# Patient Record
Sex: Female | Born: 1970 | Race: White | Hispanic: No | Marital: Married | Smoking: Never smoker
Health system: Southern US, Community
[De-identification: ages and names within clinical notes are randomized; demographics above are authoritative.]

## PROBLEM LIST (undated history)

## (undated) DIAGNOSIS — I10 Essential (primary) hypertension: Secondary | ICD-10-CM

## (undated) DIAGNOSIS — J45909 Unspecified asthma, uncomplicated: Secondary | ICD-10-CM

## (undated) DIAGNOSIS — F319 Bipolar disorder, unspecified: Secondary | ICD-10-CM

## (undated) DIAGNOSIS — E119 Type 2 diabetes mellitus without complications: Secondary | ICD-10-CM

## (undated) DIAGNOSIS — E7800 Pure hypercholesterolemia, unspecified: Secondary | ICD-10-CM

## (undated) DIAGNOSIS — F32A Depression, unspecified: Secondary | ICD-10-CM

## (undated) HISTORY — PX: EYE SURGERY: SHX253

---

## 1997-10-19 HISTORY — PX: RETINAL DETACHMENT SURGERY: SHX105

## 2006-10-19 HISTORY — PX: FIBULA FRACTURE SURGERY: SHX947

## 2007-04-27 ENCOUNTER — Inpatient Hospital Stay (HOSPITAL_COMMUNITY): Admission: EM | Admit: 2007-04-27 | Discharge: 2007-05-03 | Payer: Self-pay | Admitting: Emergency Medicine

## 2008-03-21 ENCOUNTER — Ambulatory Visit: Payer: Self-pay | Admitting: Psychology

## 2008-05-21 ENCOUNTER — Emergency Department (HOSPITAL_COMMUNITY): Admission: EM | Admit: 2008-05-21 | Discharge: 2008-05-21 | Payer: Self-pay | Admitting: Emergency Medicine

## 2008-07-27 ENCOUNTER — Ambulatory Visit: Payer: Self-pay | Admitting: Psychology

## 2008-08-02 ENCOUNTER — Ambulatory Visit: Payer: Self-pay | Admitting: Psychology

## 2008-08-15 ENCOUNTER — Ambulatory Visit: Payer: Self-pay | Admitting: Psychology

## 2008-08-22 ENCOUNTER — Ambulatory Visit: Payer: Self-pay | Admitting: Psychology

## 2008-09-19 ENCOUNTER — Ambulatory Visit: Payer: Self-pay | Admitting: Psychology

## 2008-10-03 ENCOUNTER — Ambulatory Visit: Payer: Self-pay | Admitting: Psychology

## 2008-11-08 ENCOUNTER — Ambulatory Visit: Payer: Self-pay | Admitting: Psychology

## 2008-11-29 ENCOUNTER — Encounter: Admission: RE | Admit: 2008-11-29 | Discharge: 2008-11-29 | Payer: Self-pay | Admitting: Family Medicine

## 2008-12-06 ENCOUNTER — Ambulatory Visit: Payer: Self-pay | Admitting: Psychology

## 2008-12-20 ENCOUNTER — Ambulatory Visit: Payer: Self-pay | Admitting: Psychology

## 2009-01-03 ENCOUNTER — Ambulatory Visit: Payer: Self-pay | Admitting: Psychology

## 2009-01-17 ENCOUNTER — Ambulatory Visit: Payer: Self-pay | Admitting: Psychology

## 2009-01-31 ENCOUNTER — Ambulatory Visit: Payer: Self-pay | Admitting: Psychology

## 2009-03-14 ENCOUNTER — Ambulatory Visit: Payer: Self-pay | Admitting: Psychology

## 2009-03-28 ENCOUNTER — Ambulatory Visit: Payer: Self-pay | Admitting: Psychology

## 2009-05-09 ENCOUNTER — Ambulatory Visit: Payer: Self-pay | Admitting: Psychology

## 2009-05-30 ENCOUNTER — Ambulatory Visit: Payer: Self-pay | Admitting: Psychology

## 2009-07-04 ENCOUNTER — Ambulatory Visit: Payer: Self-pay | Admitting: Psychology

## 2009-09-06 ENCOUNTER — Emergency Department (HOSPITAL_COMMUNITY): Admission: EM | Admit: 2009-09-06 | Discharge: 2009-09-06 | Payer: Self-pay | Admitting: Emergency Medicine

## 2010-10-04 ENCOUNTER — Emergency Department (HOSPITAL_COMMUNITY)
Admission: EM | Admit: 2010-10-04 | Discharge: 2010-10-05 | Payer: Self-pay | Source: Home / Self Care | Admitting: Emergency Medicine

## 2011-01-21 LAB — URINALYSIS, ROUTINE W REFLEX MICROSCOPIC
Hgb urine dipstick: NEGATIVE
Ketones, ur: 15 mg/dL — AB
Nitrite: NEGATIVE
Specific Gravity, Urine: 1.041 — ABNORMAL HIGH (ref 1.005–1.030)
pH: 5.5 (ref 5.0–8.0)

## 2011-03-03 NOTE — Op Note (Signed)
[name], [name]              ACCOUNT NO.:  [id number]   MEDICAL RECORD NO.:  [id number]          PATIENT TYPE:  INP   LOCATION:  5030                         FACILITY:  [facility]   PHYSICIAN:  Burnard Bunting, M.D.    DATE OF BIRTH:  01/11/1971   DATE OF PROCEDURE:  04/29/2007  DATE OF DISCHARGE:                               OPERATIVE REPORT   PREOPERATIVE DIAGNOSIS:  Right open tibia-fibula fracture.   POSTOPERATIVE DIAGNOSIS:  Right open tibia-fibula fracture.   PROCEDURE:  1. Repeat excisional debridement of skin, subcutaneous tissue, muscle,      fascia and bone with external fixator removal.  2. Intramedullary nailing of tibial shaft fracture  3. Open reduction internal fixation of the fibula fracture.  4. Open reduction internal fixation of medial malleolus fracture.  5. Application of skin graft over a 25 x 10 cm area with re-      application of wound-vac.   ATTENDING:  Burnard Bunting, M.D.   ASSISTANT:  [name]. [name], M.D.   ANESTHESIA:  General endotracheal.   ESTIMATED BLOOD LOSS:  100 mL.   INDICATIONS:  [name] is a 40 year old female with right distal  tib-fib fracture, who presents now for delayed intramedullary nailing  and subcuticular skin grafting.   PROCEDURE IN DETAIL:  The patient was brought to the operating room,  where general endotracheal anesthesia was induced, proper IV lines were  administered.  Right leg was prepped with the external fixator, except  for the calcaneal pin, was removed.  Right leg was then transferred to  the table, cleansed with saline, draped in sterile manner.  [name] was  used to cover the operative field around the knee, after additional  prepping with DuraPrep.  Glove was used to cover the foot.  Repeat  excisional debridement of the skin, subcutaneous tissue, muscle, fascia  and bone was then performed.  Meticulous debridement was then performed  of all structures.  Irrigation was performed, utilizing 9 liters  of  irrigating solution, as was performed in the first case.  This is a  pulsatile lavage solution.   Following irrigation and excisional debridement, an incision was made at  the knee.  Skin and subcutaneous tissue were sharply divided.  Bleeding  points were counter-controlled utilizing electrocautery.  Proximal tibia  was exposed through a medial parapatellar approach.  Awl was used to  create an entry point, which correct location was confirmed in the AP  and lateral planes under fluoroscopy.  Guide pin was then placed across  the fracture, canal was then reamed to 11.5 mm.  A 10 mm nail was then  tapped across the fracture site, then locked x2 proximally through  separate incisions and on the jig, and locked x2 distally, medial to  lateral, using free-hand technique.  Good fracture reduction was  visualized and held with 2 bone-holding clamps, while the nail was  passed.  At this time, cannulated screw was placed through the medial  meniscus under direct visualization.  It was directed away from the  intramedullary nail in the distal part of the tibia.  This  was performed  through a separate percutaneous stab incision.  This stab incision is  over a guide wire.  A 4.0 cannulated screw gave excellent compression of  the fracture.   At this time, a 2-mm Steinmann pin was placed retrograde through the  segmental fibular fracture in order to gain correct alignment.  The  fracture itself was not well aligned following fixation of the tibia.  Through this, open laceration fracture fragments could be manipulated in  order to accept the intramedullary pin.  At this time, the calcaneal pin  was removed.   Split thickness skin grafting was then performed from the thigh to the  distal tibial area, which was again irrigated and partially closed with  3-0 nylon suture.  It should be noted that decrease in the size of the  wound was feasible by suturing the skin to the fascia of the anterior   crest of the tibia proximally and to the part of the tendons distally.  In this way, fracture site was covered.  No bone was exposed.  Split  thickness skin grafting was then performed from the right proximal thigh  distally to the distally-exposed wound, which measured approximately 20  to 25 cm x 8 cm.  The split-thickness skin graft was held in position,  using staples and a chromic suture.  [name] was then re-applied over  the split-thickness skin graft.   The knee incision was closed using interrupted, inverted 0-Vicryl  suture, 2-0 Vicryl suture and skin staples.  Interlocking incisions were  also closed using interrupted Vicryl and nylon suture.  Bulky dressing  was applied.  Xeroform was applied to the right proximal thigh.  The  patient tolerated the procedure well without immediate complications.  She was transferred to the recovery room in stable condition.  It should  be noted that Dr. Lenny Pastel assistance was required during this case,  for retraction, and poor neurovascular structures, and for assistance in  manipulations of the fracture fragments during this difficult case.  His  assistance was a medical necessity.      Burnard Bunting, M.D.  Electronically Signed     GSD/MEDQ  D:  05/02/2007  T:  05/02/2007  Job:  [id number]

## 2011-03-03 NOTE — Op Note (Signed)
[name], [name]              ACCOUNT NO.:  [id number]   MEDICAL RECORD NO.:  [id number]          PATIENT TYPE:  INP   LOCATION:  5030                         FACILITY:  [facility]   PHYSICIAN:  Burnard Bunting, M.D.    DATE OF BIRTH:  [birth year]   DATE OF PROCEDURE:  04/27/2007  DATE OF DISCHARGE:                               OPERATIVE REPORT   PREOPERATIVE DIAGNOSIS:  Right grade 3 open tibia-fibula fracture with  fibular fracture and medial malleolus fracture.   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   PROCEDURES:  1. Right tib-fib irrigation and excisional debridement of skin,      subcutaneous tissue, muscle, fascia, and bone, associated with open      fracture.  2. Fasciotomy of the anterolateral compartment.  3. External fixation of tib-fib fracture.  4. Application of wound-vac.   SURGEON:  Burnard Bunting, M.D.   ASSISTANT:  None.   ANESTHESIA:  General endotracheal.   ESTIMATED BLOOD LOSS:  150 mL.   DRAINS:  None.   INDICATIONS:  [name] is a 40 year old female with severe grade 3  open right tib-fib fracture with large soft tissue defect, who presents  for operative management after explanation of risks and benefits.   PROCEDURE IN DETAIL:  The patient was brought to the operating room,  where general endotracheal anesthesia was induced.  Preoperative  antibiotics were administered.  On the right tib-fib region, the patient  had a large soft tissue defect, measuring 25 x 12 cm over the distal tib-  fib region.  The distal medial tibia was exposed over approximately 12  cm area.  Fibular fracture was also present.  Anterior compartment  muscles were exposed, but tethered at the proximal and distal end by  fascia.  Right leg fracture was highly unstable.  The foot, however, was  perfused.  The right leg was then prepped with Hibiclens and saline, and  draped in a sterile manner.   Irrigation and  debridement of this open fracture was then performed,  beginning by extending the incisions proximally and distally from the  open fracture site.  The fascia, over the anterior and lateral  compartments, was released because of previously increased muscle  compartment pressure by clinical assessment in the proximal and distal  aspect.  Following this compartment release, thorough irrigation and  excisional debridement of devitalized-appearing skin, subcutaneous  tissue, muscle, fascia, and bone was performed.  Meticulous attention  was directed towards debriding the bone edges, which were exposed.   At this time, external fixator was placed.  Some of the skin was closed  in order to put the pins through the skin and then through the medial  aspect of the tibia.  Two pins were placed in the proximal tibial  region.  Calcaneal pin was placed under fluoroscopic guidance, through  the calcaneus.  Traction was applied and the fracture was reduced.  [name] was then applied.  Bulky wrap was placed.  The patient  tolerated the procedure well without immediate complications.  She had a  perfused and sensate  foot in the recovery room.      Burnard Bunting, M.D.  Electronically Signed     GSD/MEDQ  D:  05/02/2007  T:  05/02/2007  Job:  [id number]
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
## 2011-07-17 LAB — POCT PREGNANCY, URINE: Preg Test, Ur: NEGATIVE

## 2011-07-17 LAB — URINE MICROSCOPIC-ADD ON

## 2011-07-17 LAB — DIFFERENTIAL
Basophils Absolute: 0.1
Basophils Relative: 1
Eosinophils Relative: 3
Monocytes Absolute: 0.4
Monocytes Relative: 4
Neutro Abs: 6.2

## 2011-07-17 LAB — URINALYSIS, ROUTINE W REFLEX MICROSCOPIC
Glucose, UA: NEGATIVE
Leukocytes, UA: NEGATIVE
Protein, ur: NEGATIVE
Specific Gravity, Urine: 1.007
pH: 6.5

## 2011-07-17 LAB — CBC
HCT: 37.8
Platelets: 236
RDW: 14.6
WBC: 8.2

## 2011-07-17 LAB — COMPREHENSIVE METABOLIC PANEL
AST: 20
Albumin: 3.8
Alkaline Phosphatase: 63
BUN: 12
Chloride: 105
GFR calc Af Amer: >60
Potassium: 3.9
Sodium: 138
Total Bilirubin: 0.4
Total Protein: 7.3

## 2011-08-03 LAB — CBC
Hemoglobin: 9 — ABNORMAL LOW
MCHC: 33.8
MCV: 86.9
RBC: 3.05 — ABNORMAL LOW
RDW: 14.8 — ABNORMAL HIGH

## 2011-08-04 LAB — CROSSMATCH
ABO/RH(D): O POS
Antibody Screen: NEGATIVE

## 2011-08-04 LAB — I-STAT 8, (EC8 V) (CONVERTED LAB)
BUN: 15
Chloride: 105
Glucose, Bld: 142 — ABNORMAL HIGH
HCT: 41
Hemoglobin: 13.9
Operator id: 285841
Sodium: 137

## 2011-08-04 LAB — ABO/RH: ABO/RH(D): O POS

## 2011-08-04 LAB — PROTIME-INR
INR: 1.1
INR: 2 — ABNORMAL HIGH
Prothrombin Time: 14.5
Prothrombin Time: 23.2 — ABNORMAL HIGH

## 2011-08-04 LAB — BASIC METABOLIC PANEL
CO2: 29
Calcium: 8 — ABNORMAL LOW
Creatinine, Ser: 0.71
GFR calc Af Amer: >60

## 2011-08-04 LAB — CBC
MCHC: 34
MCHC: 34.2
MCV: 86.7
Platelets: 192
RBC: 3.01 — ABNORMAL LOW
RBC: 3.13 — ABNORMAL LOW
RDW: 13.7
RDW: 14.4 — ABNORMAL HIGH
RDW: 14.8 — ABNORMAL HIGH
WBC: 7.8

## 2012-11-30 ENCOUNTER — Observation Stay (HOSPITAL_COMMUNITY)
Admission: EM | Admit: 2012-11-30 | Discharge: 2012-12-02 | DRG: 494 | Disposition: A | Discharge status: Home / Self Care | Payer: PRIVATE HEALTH INSURANCE | Attending: Orthopaedic Surgery | Admitting: Orthopaedic Surgery

## 2012-11-30 ENCOUNTER — Emergency Department (HOSPITAL_COMMUNITY): Payer: PRIVATE HEALTH INSURANCE

## 2012-11-30 ENCOUNTER — Encounter (HOSPITAL_COMMUNITY): Payer: Self-pay | Admitting: *Deleted

## 2012-11-30 DIAGNOSIS — W010XXA Fall on same level from slipping, tripping and stumbling without subsequent striking against object, initial encounter: Secondary | ICD-10-CM | POA: Insufficient documentation

## 2012-11-30 DIAGNOSIS — S82853A Displaced trimalleolar fracture of unspecified lower leg, initial encounter for closed fracture: Principal | ICD-10-CM | POA: Insufficient documentation

## 2012-11-30 DIAGNOSIS — Y9229 Other specified public building as the place of occurrence of the external cause: Secondary | ICD-10-CM | POA: Insufficient documentation

## 2012-11-30 DIAGNOSIS — S82852A Displaced trimalleolar fracture of left lower leg, initial encounter for closed fracture: Secondary | ICD-10-CM

## 2012-11-30 DIAGNOSIS — J45909 Unspecified asthma, uncomplicated: Secondary | ICD-10-CM | POA: Insufficient documentation

## 2012-11-30 DIAGNOSIS — S93439A Sprain of tibiofibular ligament of unspecified ankle, initial encounter: Secondary | ICD-10-CM | POA: Insufficient documentation

## 2012-11-30 DIAGNOSIS — I10 Essential (primary) hypertension: Secondary | ICD-10-CM | POA: Insufficient documentation

## 2012-11-30 HISTORY — DX: Unspecified asthma, uncomplicated: J45.909

## 2012-11-30 HISTORY — DX: Essential (primary) hypertension: I10

## 2012-11-30 MED ORDER — METHOCARBAMOL 500 MG PO TABS
500.0000 mg | ORAL_TABLET | Freq: Four times a day (QID) | ORAL | Status: DC | PRN
  Administered 2012-11-30 – 2012-12-01 (×3): 500 mg via ORAL
  Filled 2012-11-30 (×3): qty 1

## 2012-11-30 MED ORDER — CHLORHEXIDINE GLUCONATE 4 % EX LIQD
60.0000 mL | Freq: Once | CUTANEOUS | Status: AC
  Administered 2012-11-30: 4 via TOPICAL
  Filled 2012-11-30: qty 60

## 2012-11-30 MED ORDER — HYDROMORPHONE HCL PF 1 MG/ML IJ SOLN
0.5000 mg | INTRAMUSCULAR | Status: DC | PRN
  Filled 2012-11-30: qty 1

## 2012-11-30 MED ORDER — CEFAZOLIN SODIUM-DEXTROSE 2-3 GM-% IV SOLR
2.0000 g | INTRAVENOUS | Status: AC
  Administered 2012-12-01: 2 g via INTRAVENOUS
  Filled 2012-11-30: qty 50

## 2012-11-30 MED ORDER — HYDROMORPHONE HCL PF 1 MG/ML IJ SOLN
1.0000 mg | INTRAMUSCULAR | Status: DC | PRN
  Administered 2012-11-30 – 2012-12-02 (×12): 1 mg via INTRAVENOUS
  Filled 2012-11-30 (×11): qty 1

## 2012-11-30 MED ORDER — LIDOCAINE HCL (PF) 1 % IJ SOLN
5.0000 mL | Freq: Once | INTRAMUSCULAR | Status: DC
  Filled 2012-11-30: qty 5

## 2012-11-30 MED ORDER — PROPOFOL 10 MG/ML IV BOLUS
INTRAVENOUS | Status: AC | PRN
  Administered 2012-11-30: 50 mg via INTRAVENOUS

## 2012-11-30 MED ORDER — PROPOFOL 10 MG/ML IV BOLUS
1.0000 mg/kg | Freq: Once | INTRAVENOUS | Status: DC
  Filled 2012-11-30: qty 20

## 2012-11-30 MED ORDER — PROPOFOL 10 MG/ML IV EMUL
INTRAVENOUS | Status: AC | PRN
  Administered 2012-11-30: 50 mL via INTRAVENOUS

## 2012-11-30 MED ORDER — PROPOFOL 10 MG/ML IV EMUL
INTRAVENOUS | Status: AC
  Filled 2012-11-30: qty 100

## 2012-11-30 MED ORDER — HYDROMORPHONE HCL PF 1 MG/ML IJ SOLN
1.0000 mg | Freq: Once | INTRAMUSCULAR | Status: AC
  Administered 2012-11-30: 1 mg via INTRAVENOUS
  Filled 2012-11-30: qty 1

## 2012-11-30 MED ORDER — SODIUM CHLORIDE 0.9 % IV SOLN
INTRAVENOUS | Status: DC
  Administered 2012-11-30: 20 mL/h via INTRAVENOUS

## 2012-11-30 MED ORDER — OXYCODONE-ACETAMINOPHEN 5-325 MG PO TABS
2.0000 | ORAL_TABLET | ORAL | Status: DC | PRN
  Administered 2012-11-30 – 2012-12-02 (×3): 2 via ORAL
  Filled 2012-11-30 (×3): qty 2
  Filled 2012-11-30: qty 1

## 2012-11-30 MED ORDER — HYDROMORPHONE BOLUS VIA INFUSION: 0.5000 mg | INTRAVENOUS | Status: DC | PRN

## 2012-11-30 NOTE — ED Notes (Signed)
Pt back from radiology. Pt reports she slipped on the snow/ice and heard a tearing/cracking noise. Pt reports she was in severe pain. Pt has obvious deformity and swelling to left anterior ankle. Pt reports now her pain is much better after the pain medication. Distal pulses felt. Good capillary refill.

## 2012-11-30 NOTE — ED Notes (Signed)
Pt reports falling on ice. Has obv deformity to left ankle.

## 2012-11-30 NOTE — ED Notes (Signed)
Spoke with ortho tech will be here shortly with the splint.

## 2012-11-30 NOTE — Progress Notes (Signed)
Orthopedic Tech Progress Note Patient Details:  [name] [birth year] [id number]  Ortho Devices Type of Ortho Device: Stirrup splint Ortho Device/Splint Location: left leg Ortho Device/Splint Interventions: Application   Nikki Dom 11/30/2012, 6:39 PM

## 2012-11-30 NOTE — ED Notes (Signed)
Paged ortho 

## 2012-11-30 NOTE — ED Notes (Signed)
Pt undressed and completely naked, put in gown. Removed all jewelry. Pt sts she last ate at noon today, had soup. Denies drinking anything since then.

## 2012-11-30 NOTE — ED Notes (Signed)
Consent form for surgery in the morning has been signed and placed in chart. Copy given to patient's husband. Pt will go to OR at 0700am 12/01/12.

## 2012-11-30 NOTE — ED Notes (Signed)
Patient transported to X-ray 

## 2012-11-30 NOTE — ED Provider Notes (Signed)
History     CSN: [id number]  Arrival date & time 11/30/12  1628   First MD Initiated Contact with Patient 11/30/12 1631      Chief Complaint  Patient presents with  . Leg Injury    (Consider location/radiation/quality/duration/timing/severity/associated sxs/prior treatment) HPI  The patient presents immediately after sustaining an injury to her left ankle.  She was walking from a grocery store when she slipped.  She felt a snap, and since that time has had pain persistently in the ankle.  She denies other focal complaints, other trauma.  Pain is worse with motion, minimally better at rest.  Past Medical History  Diagnosis Date  . Hypertension   . Asthma     History reviewed. No pertinent past surgical history.  History reviewed. No pertinent family history.  History  Substance Use Topics  . Smoking status: Not on file  . Smokeless tobacco: Not on file  . Alcohol Use: Yes     Comment: occ    OB History   Grav Para Term Preterm Abortions TAB SAB Ect Mult Living                  Review of Systems  All other systems reviewed and are negative.    Allergies  Review of patient's allergies indicates no known allergies.  Home Medications  No current outpatient prescriptions on file.  BP 139/75  Pulse 101  Temp(Src) 98.1 F (36.7 C) (Oral)  Resp 22  SpO2 100%  LMP 10/26/2012  Physical Exam  Nursing note and vitals reviewed. Constitutional: She is oriented to person, place, and time. She appears well-developed and well-nourished. No distress.  HENT:  Head: Normocephalic and atraumatic.  Malampati II  Eyes: Conjunctivae and EOM are normal.  Cardiovascular: Normal rate and regular rhythm.   Pulmonary/Chest: Effort normal and breath sounds normal. No stridor. No respiratory distress.  Abdominal: She exhibits no distension.  Musculoskeletal:  There is a gross deformity about the left ankle, with ecchymosis about the medial aspect, laterally displaced foot,  with no skin tinting, preserved distal pulses and capacity to move the toes.  Neurological: She is alert and oriented to person, place, and time. No cranial nerve deficit.  Skin: Skin is warm and dry.  Psychiatric: She has a normal mood and affect.    ED Course  Procedural sedation Date/Time: 11/30/2012 6:25 PM Performed by: Gerhard Munch Authorized by: Gerhard Munch Consent: Verbal consent obtained. written consent not obtained. The procedure was performed in an emergent situation. Risks and benefits: risks, benefits and alternatives were discussed Consent given by: patient and spouse Patient understanding: patient states understanding of the procedure being performed Patient consent: the patient's understanding of the procedure matches consent given Procedure consent: procedure consent matches procedure scheduled Relevant documents: relevant documents present and verified Test results: test results available and properly labeled Site marked: the operative site was marked Imaging studies: imaging studies available Required items: required blood products, implants, devices, and special equipment available Patient identity confirmed: verbally with patient Time out: Immediately prior to procedure a "time out" was called to verify the correct patient, procedure, equipment, support staff and site/side marked as required. Preparation: Patient was prepped and draped in the usual sterile fashion. Local anesthesia used: dilaudid for analgesia. Patient sedated: yes Sedation type: moderate (conscious) sedation Sedatives: propofol Analgesia: hydromorphone Sedation start date/time: 11/30/2012 6:25 PM Sedation end date/time: 11/30/2012 6:40 PM Vitals: Vital signs were monitored during sedation. Patient tolerance: Patient tolerated the procedure well with no  immediate complications. Comments: Ankle reduced with assistance of Dr. Ophelia Charter and ortho tech. Procedure well tolerated.   (including  critical care time)  Labs Reviewed - No data to display No results found.   No diagnosis found.   O2- 99%ra, normal MDM  This pleasant female presents with a notable left ankle deformity following a mechanical fall.  On exam she is in no distress, though the ankle required reduction under conscious sedation.  The procedure was well tolerated, and the patient was admitted for planned surgical repair tomorrow.        Gerhard Munch, MD 11/30/12 [phone]

## 2012-11-30 NOTE — H&P (Signed)
[name] is an 42 y.o. female.   Chief Complaint: left trimalleolar fracture dislocation HPI: patient fell just outside [name] with left ankle deformity and fracture dislocation  Past Medical History  Diagnosis Date  . Hypertension   . Asthma     History reviewed. No pertinent past surgical history.  History reviewed. No pertinent family history. Social History:  reports that  drinks alcohol. She reports that she does not use illicit drugs. Her tobacco history is not on file.  Allergies: No Known Allergies   (Not in a hospital admission)  No results found for this or any previous visit (from the past 48 hour(s)). Dg Ankle 2 Views Left  11/30/2012  *RADIOLOGY REPORT*  Clinical Data: Fall, ankle deformity.  LEFT ANKLE - 2 VIEW  Comparison: None.  Findings: There is a bimalleolar and possible trimalleolar fracture of the left ankle.  Dislocation also noted.  The tibia projects medially relative to the talus.  Diffuse soft tissue swelling.  IMPRESSION: Bimalleolar or trimalleolar fracture with associated dislocation.   Original Report Authenticated By: Charlett Nose, M.D.     Review of Systems  Constitutional: Positive for weight loss.       On a diet with exercise to fix pre-diabetes and has lost 40 lbs over the last 6 months.   Eyes:       Glasses  Respiratory:       Hx asthma  Cardiovascular:       HTN  Gastrointestinal: Negative.   Genitourinary: Negative.   Musculoskeletal:       Left ankle deformity.   Skin: Negative.   Neurological: Negative.   Endo/Heme/Allergies: Negative.   Psychiatric/Behavioral: Negative.     Blood pressure 135/84, pulse 90, temperature 98.1 F (36.7 C), temperature source Oral, resp. rate 22, weight 90.719 kg (200 lb), last menstrual period 10/26/2012, SpO2 96.00%. Physical Exam  Constitutional: She is oriented to person, place, and time. She appears well-developed and well-nourished.  HENT:  Head: Normocephalic and atraumatic.   Eyes: EOM are normal. Pupils are equal, round, and reactive to light.  Neck: Normal range of motion. Neck supple.  Cardiovascular: Normal rate.   Respiratory: Effort normal.  GI: Soft.  Musculoskeletal:  Left ankle deformity , normal pulses and sendation , this is a closed fracture.   Neurological: She is alert and oriented to person, place, and time. She has normal reflexes.  Skin: Skin is warm and dry.  Psychiatric: She has a normal mood and affect. Her behavior is normal. Thought content normal.     Assessment/Plan Reduction of ankle fracture dislocation left and splinting tonite.    ORIF tomorrow.   Elevation ice. Procedure discussed all ?'s answered she requests we proceed.  YATES,MARK C 11/30/2012, 6:20 PM

## 2012-12-01 ENCOUNTER — Encounter (HOSPITAL_COMMUNITY): Payer: Self-pay | Admitting: Anesthesiology

## 2012-12-01 ENCOUNTER — Encounter (HOSPITAL_COMMUNITY)
Admission: EM | Disposition: A | Discharge status: Home / Self Care | Payer: Self-pay | Source: Home / Self Care | Attending: Emergency Medicine

## 2012-12-01 ENCOUNTER — Inpatient Hospital Stay (HOSPITAL_COMMUNITY): Payer: PRIVATE HEALTH INSURANCE

## 2012-12-01 ENCOUNTER — Inpatient Hospital Stay (HOSPITAL_COMMUNITY): Payer: PRIVATE HEALTH INSURANCE | Admitting: Anesthesiology

## 2012-12-01 HISTORY — PX: ORIF ANKLE FRACTURE: SHX5408

## 2012-12-01 HISTORY — PX: ORIF ANKLE FRACTURE BIMALLEOLAR: SUR920

## 2012-12-01 LAB — SURGICAL PCR SCREEN
MRSA, PCR: NEGATIVE
Staphylococcus aureus: NEGATIVE

## 2012-12-01 SURGERY — OPEN REDUCTION INTERNAL FIXATION (ORIF) ANKLE FRACTURE
Anesthesia: General | Site: Ankle | Laterality: Left | Wound class: Clean

## 2012-12-01 MED ORDER — PROMETHAZINE HCL 25 MG/ML IJ SOLN: 6.2500 mg | INTRAMUSCULAR | Status: DC | PRN

## 2012-12-01 MED ORDER — FENTANYL CITRATE 0.05 MG/ML IJ SOLN
INTRAMUSCULAR | Status: DC | PRN
  Administered 2012-12-01 (×2): 50 ug via INTRAVENOUS
  Administered 2012-12-01: 100 ug via INTRAVENOUS
  Administered 2012-12-01: 50 ug via INTRAVENOUS

## 2012-12-01 MED ORDER — SODIUM CHLORIDE 0.9 % IV SOLN
INTRAVENOUS | Status: DC
  Administered 2012-12-01 (×2): via INTRAVENOUS

## 2012-12-01 MED ORDER — HYDROMORPHONE HCL PF 1 MG/ML IJ SOLN
1.0000 mg | Freq: Once | INTRAMUSCULAR | Status: AC
  Administered 2012-12-01: 1 mg via INTRAVENOUS
  Filled 2012-12-01: qty 1

## 2012-12-01 MED ORDER — LACTATED RINGERS IV SOLN: INTRAVENOUS | Status: DC

## 2012-12-01 MED ORDER — METOCLOPRAMIDE HCL 10 MG PO TABS: 5.0000 mg | ORAL_TABLET | Freq: Three times a day (TID) | ORAL | Status: DC | PRN

## 2012-12-01 MED ORDER — ONDANSETRON HCL 4 MG PO TABS: 4.0000 mg | ORAL_TABLET | Freq: Four times a day (QID) | ORAL | Status: DC | PRN

## 2012-12-01 MED ORDER — ONDANSETRON HCL 4 MG/2ML IJ SOLN
INTRAMUSCULAR | Status: DC | PRN
  Administered 2012-12-01: 4 mg via INTRAVENOUS

## 2012-12-01 MED ORDER — BUPIVACAINE HCL (PF) 0.25 % IJ SOLN
INTRAMUSCULAR | Status: AC
  Filled 2012-12-01: qty 30

## 2012-12-01 MED ORDER — ONDANSETRON HCL 4 MG/2ML IJ SOLN: 4.0000 mg | Freq: Four times a day (QID) | INTRAMUSCULAR | Status: DC | PRN

## 2012-12-01 MED ORDER — FENTANYL CITRATE 0.05 MG/ML IJ SOLN
INTRAMUSCULAR | Status: AC
  Filled 2012-12-01: qty 2

## 2012-12-01 MED ORDER — DOCUSATE SODIUM 100 MG PO CAPS
100.0000 mg | ORAL_CAPSULE | Freq: Two times a day (BID) | ORAL | Status: DC
  Administered 2012-12-01 – 2012-12-02 (×3): 100 mg via ORAL
  Filled 2012-12-01 (×3): qty 1

## 2012-12-01 MED ORDER — FENTANYL CITRATE 0.05 MG/ML IJ SOLN
25.0000 ug | INTRAMUSCULAR | Status: DC | PRN
  Administered 2012-12-01 (×2): 50 ug via INTRAVENOUS

## 2012-12-01 MED ORDER — HYDROCODONE-ACETAMINOPHEN 5-325 MG PO TABS
1.0000 | ORAL_TABLET | ORAL | Status: DC | PRN
  Administered 2012-12-01 (×2): 2 via ORAL
  Filled 2012-12-01 (×3): qty 2

## 2012-12-01 MED ORDER — 0.9 % SODIUM CHLORIDE (POUR BTL) OPTIME
TOPICAL | Status: DC | PRN
  Administered 2012-12-01: 1000 mL

## 2012-12-01 MED ORDER — MEPERIDINE HCL 25 MG/ML IJ SOLN: 6.2500 mg | INTRAMUSCULAR | Status: DC | PRN

## 2012-12-01 MED ORDER — DEXAMETHASONE SODIUM PHOSPHATE 4 MG/ML IJ SOLN
INTRAMUSCULAR | Status: DC | PRN
  Administered 2012-12-01: 8 mg via INTRAVENOUS

## 2012-12-01 MED ORDER — PROPOFOL 10 MG/ML IV BOLUS
INTRAVENOUS | Status: DC | PRN
  Administered 2012-12-01: 200 mg via INTRAVENOUS

## 2012-12-01 MED ORDER — FLEET ENEMA 7-19 GM/118ML RE ENEM
1.0000 | ENEMA | Freq: Once | RECTAL | Status: AC | PRN
  Filled 2012-12-01: qty 1

## 2012-12-01 MED ORDER — LIDOCAINE HCL (CARDIAC) 10 MG/ML IV SOLN
INTRAVENOUS | Status: DC | PRN
  Administered 2012-12-01: 70 mg via INTRAVENOUS

## 2012-12-01 MED ORDER — BISACODYL 10 MG RE SUPP: 10.0000 mg | Freq: Every day | RECTAL | Status: DC | PRN

## 2012-12-01 MED ORDER — POLYETHYLENE GLYCOL 3350 17 G PO PACK
17.0000 g | PACK | Freq: Every day | ORAL | Status: DC | PRN
  Filled 2012-12-01: qty 1

## 2012-12-01 MED ORDER — LACTATED RINGERS IV SOLN
INTRAVENOUS | Status: DC | PRN
  Administered 2012-12-01: 07:00:00 via INTRAVENOUS

## 2012-12-01 MED ORDER — MIDAZOLAM HCL 5 MG/5ML IJ SOLN
INTRAMUSCULAR | Status: DC | PRN
  Administered 2012-12-01: 2 mg via INTRAVENOUS

## 2012-12-01 MED ORDER — METOCLOPRAMIDE HCL 5 MG/ML IJ SOLN
5.0000 mg | Freq: Three times a day (TID) | INTRAMUSCULAR | Status: DC | PRN
  Filled 2012-12-01: qty 2

## 2012-12-01 MED ORDER — BUPIVACAINE HCL (PF) 0.25 % IJ SOLN
INTRAMUSCULAR | Status: DC | PRN
  Administered 2012-12-01: 18 mL

## 2012-12-01 SURGICAL SUPPLY — 67 items
BANDAGE ELASTIC 4 VELCRO ST LF (GAUZE/BANDAGES/DRESSINGS) ×2 IMPLANT
BANDAGE ELASTIC 6 VELCRO ST LF (GAUZE/BANDAGES/DRESSINGS) ×2 IMPLANT
BANDAGE ESMARK 6X9 LF (GAUZE/BANDAGES/DRESSINGS) IMPLANT
BIT DRILL 2.5X2.75 QC CALB (BIT) ×2 IMPLANT
BIT DRILL 2.9 CANN QC NONSTRL (BIT) ×2 IMPLANT
BIT DRILL CALIBRATED 2.7 (BIT) ×2 IMPLANT
BNDG ESMARK 6X9 LF (GAUZE/BANDAGES/DRESSINGS)
CLOTH BEACON ORANGE TIMEOUT ST (SAFETY) ×2 IMPLANT
COVER MAYO STAND STRL (DRAPES) ×2 IMPLANT
COVER SURGICAL LIGHT HANDLE (MISCELLANEOUS) ×2 IMPLANT
CUFF TOURNIQUET SINGLE 34IN LL (TOURNIQUET CUFF) ×2 IMPLANT
CUFF TOURNIQUET SINGLE 44IN (TOURNIQUET CUFF) IMPLANT
DRAPE C-ARM 42X72 X-RAY (DRAPES) IMPLANT
DRAPE INCISE IOBAN 66X45 STRL (DRAPES) IMPLANT
DRAPE PROXIMA HALF (DRAPES) ×2 IMPLANT
DRAPE U-SHAPE 47X51 STRL (DRAPES) ×2 IMPLANT
DRSG PAD ABDOMINAL 8X10 ST (GAUZE/BANDAGES/DRESSINGS) ×2 IMPLANT
DURAPREP 26ML APPLICATOR (WOUND CARE) ×2 IMPLANT
ELECT REM PT RETURN 9FT ADLT (ELECTROSURGICAL) ×2
ELECTRODE REM PT RTRN 9FT ADLT (ELECTROSURGICAL) ×1 IMPLANT
GAUZE XEROFORM 5X9 LF (GAUZE/BANDAGES/DRESSINGS) ×2 IMPLANT
GLOVE BIO SURGEON STRL SZ8.5 (GLOVE) ×4 IMPLANT
GLOVE BIOGEL PI IND STRL 6.5 (GLOVE) ×2 IMPLANT
GLOVE BIOGEL PI IND STRL 7.5 (GLOVE) ×1 IMPLANT
GLOVE BIOGEL PI IND STRL 8 (GLOVE) ×2 IMPLANT
GLOVE BIOGEL PI INDICATOR 6.5 (GLOVE) ×2
GLOVE BIOGEL PI INDICATOR 7.5 (GLOVE) ×1
GLOVE BIOGEL PI INDICATOR 8 (GLOVE) ×2
GLOVE ECLIPSE 7.0 STRL STRAW (GLOVE) ×2 IMPLANT
GLOVE ORTHO TXT STRL SZ7.5 (GLOVE) ×4 IMPLANT
GLOVE SURG SS PI 8.5 STRL IVOR (GLOVE) ×2
GLOVE SURG SS PI 8.5 STRL STRW (GLOVE) ×2 IMPLANT
GOWN PREVENTION PLUS LG XLONG (DISPOSABLE) IMPLANT
GOWN PREVENTION PLUS XLARGE (GOWN DISPOSABLE) ×2 IMPLANT
GOWN STRL NON-REIN LRG LVL3 (GOWN DISPOSABLE) ×4 IMPLANT
K-WIRE ACE 1.6X6 (WIRE) ×2
KIT BASIN OR (CUSTOM PROCEDURE TRAY) ×2 IMPLANT
KIT ROOM TURNOVER OR (KITS) ×2 IMPLANT
KWIRE ACE 1.6X6 (WIRE) ×1 IMPLANT
MANIFOLD NEPTUNE II (INSTRUMENTS) IMPLANT
NS IRRIG 1000ML POUR BTL (IV SOLUTION) ×2 IMPLANT
PACK ORTHO EXTREMITY (CUSTOM PROCEDURE TRAY) ×2 IMPLANT
PAD ARMBOARD 7.5X6 YLW CONV (MISCELLANEOUS) ×4 IMPLANT
PAD CAST 4YDX4 CTTN HI CHSV (CAST SUPPLIES) ×1 IMPLANT
PADDING CAST COTTON 4X4 STRL (CAST SUPPLIES) ×1
PADDING CAST COTTON 6X4 STRL (CAST SUPPLIES) ×2 IMPLANT
PLATE LOCK 7H 92 BILAT FIB (Plate) ×2 IMPLANT
SCREW ACE CAN 4.0 44M (Screw) ×4 IMPLANT
SCREW LOCK CORT STAR 3.5X12 (Screw) ×4 IMPLANT
SCREW LOCK CORT STAR 3.5X14 (Screw) ×2 IMPLANT
SCREW LOW PROFILE 12MMX3.5MM (Screw) ×2 IMPLANT
SCREW LP 3.5 (Screw) ×2 IMPLANT
SCREW NON LOCKING LP 3.5 14MM (Screw) ×4 IMPLANT
SPLINT PLASTER CAST XFAST 5X30 (CAST SUPPLIES) ×1 IMPLANT
SPLINT PLASTER XFAST SET 5X30 (CAST SUPPLIES) ×1
SPONGE GAUZE 4X4 12PLY (GAUZE/BANDAGES/DRESSINGS) ×2 IMPLANT
SPONGE LAP 18X18 X RAY DECT (DISPOSABLE) ×2 IMPLANT
STAPLER VISISTAT 35W (STAPLE) IMPLANT
SUCTION FRAZIER TIP 10 FR DISP (SUCTIONS) ×2 IMPLANT
SUT ETHILON 3 0 PS 1 (SUTURE) ×4 IMPLANT
SUT VIC AB 2-0 CT1 27 (SUTURE) ×2
SUT VIC AB 2-0 CT1 TAPERPNT 27 (SUTURE) ×2 IMPLANT
TOWEL OR 17X24 6PK STRL BLUE (TOWEL DISPOSABLE) ×2 IMPLANT
TOWEL OR 17X26 10 PK STRL BLUE (TOWEL DISPOSABLE) ×2 IMPLANT
TUBE CONNECTING 12X1/4 (SUCTIONS) ×2 IMPLANT
WATER STERILE IRR 1000ML POUR (IV SOLUTION) ×2 IMPLANT
YANKAUER SUCT BULB TIP NO VENT (SUCTIONS) ×2 IMPLANT

## 2012-12-01 NOTE — Interval H&P Note (Signed)
History and Physical Interval Note:  12/01/2012 7:22 AM  [name]  has presented today for surgery, with the diagnosis of Fractured Left ankle  The various methods of treatment have been discussed with the patient and family. After consideration of risks, benefits and other options for treatment, the patient has consented to  Procedure(s): OPEN REDUCTION INTERNAL FIXATION (ORIF) ANKLE FRACTURE (Left) as a surgical intervention .  The patient's history has been reviewed, patient examined, no change in status, stable for surgery.  I have reviewed the patient's chart and labs.  Questions were answered to the patient's satisfaction.     Tae Robak C

## 2012-12-01 NOTE — Op Note (Signed)
Test  Preop diagnosis: Left trimalleolar ankle fracture with syndesmosis rupture  Postop diagnosis: Same  Procedure: ORIF bimalleolar fixation of trimalleolar ankle fracture, left. ORIF syndesmosis with syndesmotic screw placement.  Surgeon: Annell Greening M.D.  Anesthesia: Gen. +15 cc Marcaine local  Tourniquet: 57 minutes  Implants Biomet composite fibular plate with combination locking and nonlocking screws.  Procedure: After induction general anesthesia operculum patient preoperative Ancef prophylaxis proximal thigh tourniquet application scrubbing of the foot followed by DuraPrep extremity sheets drapes canal towel clip stockinette and extremity sheet timeout procedure was completed leg was wrapped with an Esmarch tourniquet was inflated.  Medial decision C-shaped anterior to the medial malleolus was made fracture was distracted hematoma was evacuated. There was no periosteum or posterior tibial tendon that was blocking reduction and joint hematoma was evacuated. Lateral incision was made fibula was reduced there was significant comminution of the medial cortex of the fibula involving the syndesmosis with widening and instability on exam. A composite plate was selected and distal 3 locking screws are placed after the plate was pulled down the tip of the plate was bent to the distalmost screw could be placed at an angle. Proximally bicortical screws were placed nonlocking and then a subtendinous modest syndesmosis screws placed under fluoroscopy. It was tightened down securely however. He overtighten due to the comminution medial. Screw was backed out removed and then replaced with appropriate tightness to prevent pushing the distal fragment over into varus. Once this was confirmed AP and lateral fluoroscopy medial side was addressed into lag screws 45 mm partially threaded were placed with anatomic fixation of t lateral images were obtained. Alignment looked good details as well reduced mortise  was reduced. Patient tolerated procedure well tourniquet was deflated irrigated closure with 2-0 Vicryl subtendinous tissue skin staple closure Wynette [place] infiltration 15 cc total of 25 needle. 4 x 4's and lateral and shortly split was applied followed by Ace wrap. Patient tolerated procedure well instrument count needle count was correct.  Annell Greening     MD

## 2012-12-01 NOTE — Anesthesia Preprocedure Evaluation (Addendum)
Anesthesia Evaluation  Patient identified by MRN, date of birth, ID band Patient awake    Reviewed: Allergy & Precautions, H&P , NPO status , Patient's Chart, lab work & pertinent test results  Airway Mallampati: II TM Distance: >3 FB Neck ROM: Full    Dental no notable dental hx.    Pulmonary neg pulmonary ROS, asthma ,  breath sounds clear to auscultation  Pulmonary exam normal       Cardiovascular hypertension, negative cardio ROS  Rhythm:Regular Rate:Normal     Neuro/Psych negative neurological ROS  negative psych ROS   GI/Hepatic negative GI ROS, Neg liver ROS,   Endo/Other  negative endocrine ROS  Renal/GU negative Renal ROS  negative genitourinary   Musculoskeletal negative musculoskeletal ROS (+)   Abdominal   Peds negative pediatric ROS (+)  Hematology negative hematology ROS (+)   Anesthesia Other Findings   Reproductive/Obstetrics negative OB ROS                          Anesthesia Physical Anesthesia Plan  ASA: II  Anesthesia Plan: General   Post-op Pain Management:    Induction: Intravenous  Airway Management Planned: LMA  Additional Equipment:   Intra-op Plan:   Post-operative Plan: Extubation in OR  Informed Consent: I have reviewed the patients History and Physical, chart, labs and discussed the procedure including the risks, benefits and alternatives for the proposed anesthesia with the patient or authorized representative who has indicated his/her understanding and acceptance.   Dental advisory given  Plan Discussed with: CRNA  Anesthesia Plan Comments:         Anesthesia Quick Evaluation

## 2012-12-01 NOTE — Transfer of Care (Signed)
Immediate Anesthesia Transfer of Care Note  Patient: [name]  Procedure(s) Performed: Procedure(s): OPEN REDUCTION INTERNAL FIXATION (ORIF) ANKLE FRACTURE (Left)  Patient Location: PACU  Anesthesia Type:General  Level of Consciousness: awake, alert , oriented and patient cooperative  Airway & Oxygen Therapy: Patient Spontanous Breathing  Post-op Assessment: Report given to PACU RN, Post -op Vital signs reviewed and stable and Patient moving all extremities  Post vital signs: Reviewed and stable  Complications: No apparent anesthesia complications

## 2012-12-02 MED ORDER — OXYCODONE-ACETAMINOPHEN 5-325 MG PO TABS: 2.0000 | ORAL_TABLET | ORAL | Status: DC | PRN

## 2012-12-02 NOTE — Evaluation (Signed)
Occupational Therapy Evaluation and Discharge Patient Details Name: [name] MRN: [id number] DOB: July 10, 1971 Today's Date: 12/02/2012 Time: 1478-2956 OT Time Calculation (min): 10 min  OT Assessment / Plan / Recommendation Clinical Impression  This 42 yo female s/p fall and now s/p ORIF bimalleolar fixation of trimalleolar ankle fracture, left. ORIF syndesmosis with syndesmotic screw placement presents to acute OT with all education completed. Will D/C from acute OT.    OT Assessment  Patient does not need any further OT services    Follow Up Recommendations  No OT follow up       Equipment Recommendations  None recommended by OT          Precautions / Restrictions Precautions Precautions: None Restrictions Weight Bearing Restrictions: Yes LLE Weight Bearing: Non weight bearing       ADL  Equipment Used: Rolling walker Transfers/Ambulation Related to ADLs: Mod I for all ADL Comments: Pt is at a mod I level for all BADLs with use of DME        Visit Information  Last OT Received On: 12/02/12 Assistance Needed: +1 PT/OT Co-Evaluation/Treatment: Yes (partial)    Subjective Data  Subjective: I have so mush experience with this with my left leg   Prior Functioning     Home Living Lives With: Spouse;Daughter Available Help at Discharge: Family Type of Home: House Home Access: Stairs to enter Secretary/administrator of Steps: 1 Home Layout: Two level Alternate Level Stairs-Number of Steps: 15 Bathroom Shower/Tub: Engineer, manufacturing systems: Standard Bathroom Accessibility: Yes How Accessible: Accessible via walker Home Adaptive Equipment: Bedside commode/3-in-1;Walker - rolling Prior Function Level of Independence: Independent Able to Take Stairs?: Yes Driving: Yes 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 for tasks assessed Left Upper Extremity Assessment LUE ROM/Strength/Tone: WFL for tasks assessed Right Lower Extremity Assessment RLE ROM/Strength/Tone: [facility] for tasks assessed Left Lower Extremity Assessment LLE ROM/Strength/Tone: Deficits;Unable to fully assess;Due to pain;Due to precautions     Mobility Bed Mobility Bed Mobility: Supine to Sit;Sitting - Scoot to Edge of Bed Supine to Sit: 6: Modified independent (Device/Increase time) Sitting - Scoot to Edge of Bed: 6: Modified independent (Device/Increase time) Transfers Sit to Stand: 5: Supervision Stand to Sit: 5: Supervision Details for Transfer Assistance: VC's for hand placement           End of Session OT - End of Session Activity Tolerance: Patient tolerated treatment well Patient left: in bed;with call bell/phone within reach;with family/visitor present Nurse Communication:  (Pt ready to be D/C'd home from a therapy standpoint)       Evette Georges [phone] 12/02/2012, 2:20 PM

## 2012-12-02 NOTE — Progress Notes (Signed)
Pt discharged to home. Discharge instructions explained pt verbalized understanding

## 2012-12-02 NOTE — Anesthesia Postprocedure Evaluation (Signed)
  Anesthesia Post-op Note  Patient: [name]  Procedure(s) Performed: Procedure(s) (LRB): OPEN REDUCTION INTERNAL FIXATION (ORIF) ANKLE FRACTURE (Left)  Patient Location: PACU  Anesthesia Type: General  Level of Consciousness: awake and alert   Airway and Oxygen Therapy: Patient Spontanous Breathing  Post-op Pain: mild  Post-op Assessment: Post-op Vital signs reviewed, Patient's Cardiovascular Status Stable, Respiratory Function Stable, Patent Airway and No signs of Nausea or vomiting  Last Vitals:  Filed Vitals:   12/02/12 0702  BP: 98/48  Pulse: 70  Temp: 36.4 C  Resp: 18    Post-op Vital Signs: stable   Complications: No apparent anesthesia complications

## 2012-12-02 NOTE — Discharge Summary (Signed)
Physician Discharge Summary  Patient ID: [name] MRN: [id number] DOB/AGE: 1971/05/08 42 y.o.  Admit date: 11/30/2012 Discharge date: 12/02/2012  Admission Diagnoses: trimalleolar ankle fracture , closed left  Discharge Diagnoses:  Active Problems:   Closed trimalleolar fracture of left ankle  syndsosmotic rupture Discharged Condition: good  Hospital Course: underwent surgery ORIF medial and lateral malleolus and syndosmotic   placement  Consults: None  Significant Diagnostic Studies:   Treatments: surgery: above  Discharge Exam: Blood pressure 122/53, pulse 82, temperature 97.8 F (36.6 C), temperature source Oral, resp. rate 20, weight 90.719 kg (200 lb), last menstrual period 10/26/2012, SpO2 100.00%.   Disposition: 01-Home or Self Care     Medication List    TAKE these medications       Fluticasone-Salmeterol 500-50 MCG/DOSE Aepb  Commonly known as:  ADVAIR  Inhale 1 puff into the lungs every evening.     multivitamin with minerals Tabs  Take 1 tablet by mouth daily.     olmesartan-hydrochlorothiazide 20-12.5 MG per tablet  Commonly known as:  BENICAR HCT  Take 1 tablet by mouth every evening.     oxyCODONE-acetaminophen 5-325 MG per tablet  Commonly known as:  PERCOCET/ROXICET  Take 2 tablets by mouth every 4 (four) hours as needed.     vitamin C 500 MG tablet  Commonly known as:  ASCORBIC ACID  Take 500 mg by mouth daily.       underwent surgery , OT, PT consult , satisfactory post op course. xrays looked good.   SignedEldred Manges 12/02/2012, 6:21 PM

## 2012-12-02 NOTE — Evaluation (Signed)
Physical Therapy Evaluation Patient Details Name: [name] MRN: [id number] DOB: Oct 19, 1971 Today's Date: 12/02/2012 Time: 4098-1191 PT Time Calculation (min): 19 min  PT Assessment / Plan / Recommendation Clinical Impression  Pt is 42 y.o. female s/p ankle fx with surgical repair.  Pt demonstrates safe techniques and verbalizes understanding for mobility.  Pt has good support and is safe for d.c. at this time.    PT Assessment  Patent does not need any further PT services    Follow Up Recommendations                      Precautions / Restrictions Precautions Precautions: None Restrictions Weight Bearing Restrictions: Yes LLE Weight Bearing: Non weight bearing   Pertinent Vitals/Pain 4/10      Mobility  Bed Mobility Bed Mobility: Supine to Sit;Sitting - Scoot to Edge of Bed Supine to Sit: 6: Modified independent (Device/Increase time) Sitting - Scoot to Edge of Bed: 6: Modified independent (Device/Increase time) Transfers Transfers: Sit to Stand;Stand to Sit Sit to Stand: 5: Supervision Stand to Sit: 5: Supervision Details for Transfer Assistance: VC's for hand placement Ambulation/Gait Ambulation/Gait Assistance: 5: Supervision Ambulation Distance (Feet): 60 Feet Assistive device: Rolling walker Ambulation/Gait Assistance Details: no cues needed Gait Pattern: Step-to pattern Stairs:  (pt to use bump up method, pt verbalized experience in past)      Visit Information  Last PT Received On: 12/02/12 Assistance Needed: +1    Subjective Data  Subjective: I'm excited to get home Patient Stated Goal: to go home   Prior Functioning  Home Living Lives With: Spouse;Daughter Available Help at Discharge: Family Type of Home: House Home Access: Stairs to enter Secretary/administrator of Steps: 1 Home Layout: Two level Alternate Level Stairs-Number of Steps: 15 Bathroom Shower/Tub: Engineer, manufacturing systems: Standard Bathroom Accessibility:  Yes How Accessible: Accessible via walker Home Adaptive Equipment: Bedside commode/3-in-1;Walker - rolling Prior Function Level of Independence: Independent Able to Take Stairs?: Yes Driving: Yes 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 Communication: Other (comment) (glasses) Dominant Hand: Right    Cognition  Cognition Overall Cognitive Status: Appears within functional limits for tasks assessed/performed Arousal/Alertness: Awake/alert Orientation Level: Appears intact for tasks assessed;Oriented X4 / Intact Behavior During Session: [facility] for tasks performed    Extremity/Trunk Assessment Right Upper Extremity Assessment RUE ROM/Strength/Tone: [facility] for tasks assessed Left Upper Extremity Assessment LUE ROM/Strength/Tone: [facility] for tasks assessed Right Lower Extremity Assessment RLE ROM/Strength/Tone: [facility] for tasks assessed Left Lower Extremity Assessment LLE ROM/Strength/Tone: Deficits;Unable to fully assess;Due to pain;Due to precautions   Balance    End of Session PT - End of Session Equipment Utilized During Treatment: Gait belt Activity Tolerance: Patient tolerated treatment well Patient left: in bed;with call bell/phone within reach;with family/visitor present Nurse Communication: Mobility status  GP     Fabio Asa 12/02/2012, 2:07 PM  Charlotte Crumb, PT DPT  [phone]

## 2012-12-05 ENCOUNTER — Encounter (HOSPITAL_COMMUNITY): Payer: Self-pay | Admitting: Orthopaedic Surgery

## 2013-04-15 ENCOUNTER — Emergency Department (HOSPITAL_COMMUNITY)
Admission: EM | Admit: 2013-04-15 | Discharge: 2013-04-15 | Disposition: A | Discharge status: Home / Self Care | Payer: PRIVATE HEALTH INSURANCE | Attending: Emergency Medicine | Admitting: Emergency Medicine

## 2013-04-15 ENCOUNTER — Emergency Department (HOSPITAL_COMMUNITY): Payer: PRIVATE HEALTH INSURANCE

## 2013-04-15 ENCOUNTER — Encounter (HOSPITAL_COMMUNITY): Payer: Self-pay | Admitting: Emergency Medicine

## 2013-04-15 DIAGNOSIS — S61001A Unspecified open wound of right thumb without damage to nail, initial encounter: Secondary | ICD-10-CM

## 2013-04-15 DIAGNOSIS — W268XXA Contact with other sharp object(s), not elsewhere classified, initial encounter: Secondary | ICD-10-CM | POA: Insufficient documentation

## 2013-04-15 DIAGNOSIS — J45909 Unspecified asthma, uncomplicated: Secondary | ICD-10-CM | POA: Insufficient documentation

## 2013-04-15 DIAGNOSIS — Y929 Unspecified place or not applicable: Secondary | ICD-10-CM | POA: Insufficient documentation

## 2013-04-15 DIAGNOSIS — Z79899 Other long term (current) drug therapy: Secondary | ICD-10-CM | POA: Insufficient documentation

## 2013-04-15 DIAGNOSIS — IMO0002 Reserved for concepts with insufficient information to code with codable children: Secondary | ICD-10-CM | POA: Insufficient documentation

## 2013-04-15 DIAGNOSIS — I10 Essential (primary) hypertension: Secondary | ICD-10-CM | POA: Insufficient documentation

## 2013-04-15 DIAGNOSIS — Z23 Encounter for immunization: Secondary | ICD-10-CM | POA: Insufficient documentation

## 2013-04-15 DIAGNOSIS — S61209A Unspecified open wound of unspecified finger without damage to nail, initial encounter: Secondary | ICD-10-CM | POA: Insufficient documentation

## 2013-04-15 DIAGNOSIS — Y9389 Activity, other specified: Secondary | ICD-10-CM | POA: Insufficient documentation

## 2013-04-15 MED ORDER — OXYCODONE-ACETAMINOPHEN 5-325 MG PO TABS
2.0000 | ORAL_TABLET | Freq: Once | ORAL | Status: DC
  Administered 2013-04-15: 2 via ORAL
  Filled 2013-04-15: qty 2

## 2013-04-15 MED ORDER — TETANUS-DIPHTH-ACELL PERTUSSIS 5-2.5-18.5 LF-MCG/0.5 IM SUSP
0.5000 mL | Freq: Once | INTRAMUSCULAR | Status: AC
  Administered 2013-04-15: 0.5 mL via INTRAMUSCULAR
  Filled 2013-04-15: qty 0.5

## 2013-04-15 NOTE — ED Provider Notes (Signed)
History    CSN: [id number] Arrival date & time 04/15/13  1953  First MD Initiated Contact with Patient 04/15/13 2039     Chief Complaint  Patient presents with  . Laceration   (Consider location/radiation/quality/duration/timing/severity/associated sxs/prior Treatment) HPI Comments: Patient is a 42 year old female with no significant past medical history presents for avulsion to the pad of her right distal thumb. Patient states that she was cutting potatoes when she looked away and subsequently sliced the pad of her right thumb. Patient is to throbbing pain sensation that is nonradiating; worse with palpation and without alleviating factors. Patient denies fevers, pallor or erythema of her right thumb, numbness or tingling, and right hand or finger weakness. Patient is not on blood thinners. Tetanus out of date; last administered 8 years ago.  Patient is a 43 y.o. female presenting with skin laceration. The history is provided by the patient. No language interpreter was used.  Laceration  Past Medical History  Diagnosis Date  . Hypertension   . Asthma    Past Surgical History  Procedure Laterality Date  . Orif ankle fracture bimalleolar  12/01/2012    Dr Ophelia Charter  . Fibula fracture surgery  2008    right leg  . Cesarean section  2000  . Retinal detachment surgery  1999    OD  . Orif ankle fracture Left 12/01/2012    Procedure: OPEN REDUCTION INTERNAL FIXATION (ORIF) ANKLE FRACTURE;  Surgeon: Eldred Manges, MD;  Location: [facility] OR;  Service: Orthopedics;  Laterality: Left;   No family history on file. History  Substance Use Topics  . Smoking status: Never Smoker   . Smokeless tobacco: Never Used  . Alcohol Use: Yes     Comment: occ   OB History   Grav Para Term Preterm Abortions TAB SAB Ect Mult Living                 Review of Systems  Constitutional: Negative for fever.  Musculoskeletal: Negative for joint swelling and arthralgias.  Skin: Positive for wound. Negative for  color change and pallor.  Neurological: Negative for weakness and numbness.  All other systems reviewed and are negative.    Allergies  Shellfish allergy and Watermelon  Home Medications   Current Outpatient Rx  Name  Route  Sig  Dispense  Refill  . acetaminophen (TYLENOL) 500 MG tablet   Oral   Take 1,000 mg by mouth daily as needed (for headache).         Marland Kitchen albuterol (PROVENTIL) (2.5 MG/3ML) 0.083% nebulizer solution   Nebulization   Take 2.5 mg by nebulization every 6 (six) hours as needed for wheezing.         . Fluticasone-Salmeterol (ADVAIR) 500-50 MCG/DOSE AEPB   Inhalation   Inhale 1 puff into the lungs every evening.         Marland Kitchen HYDROcodone-acetaminophen (NORCO/VICODIN) 5-325 MG per tablet   Oral   Take 1 tablet by mouth every 6 (six) hours as needed for pain.         . naproxen sodium (ANAPROX) 220 MG tablet   Oral   Take 440 mg by mouth 2 (two) times daily as needed (for pain).         Marland Kitchen olmesartan-hydrochlorothiazide (BENICAR HCT) 20-12.5 MG per tablet   Oral   Take 1 tablet by mouth every evening.          BP 127/64  Pulse 76  Temp(Src) 97.7 F (36.5 C) (Oral)  Resp 16  SpO2 99%  LMP 03/30/2013 Physical Exam  Nursing note and vitals reviewed. Constitutional: She is oriented to person, place, and time. She appears well-developed and well-nourished. No distress.  HENT:  Head: Normocephalic and atraumatic.  Mouth/Throat: Oropharynx is clear and moist. No oropharyngeal exudate.  Eyes: Conjunctivae and EOM are normal. No scleral icterus.  Cardiovascular: Normal rate, regular rhythm and intact distal pulses.   Distal radial pulses 2+ bilaterally. Capillary refill normal.  Pulmonary/Chest: Effort normal. No respiratory distress.  Musculoskeletal:       Right hand: She exhibits tenderness and laceration (Avulsed distal right thumb on fat pad). She exhibits normal range of motion, no bony tenderness, normal two-point discrimination, normal  capillary refill, no deformity and no swelling. Normal sensation noted. Normal strength noted. She exhibits no thumb/finger opposition.       Hands: Neurological: She is alert and oriented to person, place, and time.  No sensory or motor deficits appreciated.  Skin: Skin is warm and dry. No rash noted. She is not diaphoretic. No erythema. No pallor.  Psychiatric: She has a normal mood and affect. Her behavior is normal.    ED Course  Procedures (including critical care time) Labs Reviewed - No data to display No results found.  Dg Finger Thumb Right  04/15/2013   *RADIOLOGY REPORT*  Clinical Data: Laceration to the lateral aspect of the distal phalanx  RIGHT THUMB 2+V  Comparison: None.  Findings:  A bandage overlies the tuft of the distal phalanx of the thumb with suspected adjacent soft tissue stranding.  This finding is without associated fracture or radiopaque foreign body.  Joint spaces are preserved.  No erosions.  IMPRESSION: Soft tissue stranding about the tuft of the distal phalanx of the thumb without associated fracture or radiopaque foreign body.   Original Report Authenticated By: Tacey Ruiz, MD    1. Avulsion of skin of right thumb without complication     MDM  Uncomplicated skin avulsion of R thumb 2/2 slicing at home with mandelin. Patient neurovascularly intact with no decreased ROM in RUE or hand. Tetanus updated in ED and bleeding controlled with Wound Seal and pressure to avulsion site. Wound dressed and patient instructed not to change dressing or wash thumb or get wet for 24+ hours. Patient afebrile, hemodynamically stable and appropriate for d/c with hand specialist follow up for further evaluation of injury. Patient with Norco at home. Indications for ED return discussed with patient who verbalizes comfort and understanding with plan with no unaddressed concerns.  Filed Vitals:   04/15/13 2004 04/15/13 2319  BP: 184/81 127/64  Pulse: 89 76  Temp: 99.1 F (37.3 C)  97.7 F (36.5 C)  TempSrc: Oral Oral  Resp: 14 16  SpO2: 97% 99%         Antony Madura, PA-C 04/20/13 1913

## 2013-04-15 NOTE — ED Notes (Signed)
Bleeding of right thumb controlled

## 2013-04-15 NOTE — ED Notes (Signed)
PT. PRESENTS WITH SLICED DISTAL RIGHT THUMB SUSTAINED WHILE CUTTING POTATOES THIS EVENING , DRESSING APPLIED AT TRIAGE WITH MODERATE BLEEDING .

## 2013-04-15 NOTE — ED Notes (Signed)
Patient returned from xray.

## 2013-04-15 NOTE — ED Notes (Signed)
Pt returned from radiology.

## 2013-04-21 NOTE — ED Provider Notes (Signed)
Medical screening examination/treatment/procedure(s) were performed by non-physician practitioner and as supervising physician I was immediately available for consultation/collaboration.   Rolan Bucco, MD 04/21/13 [phone]

## 2015-09-19 ENCOUNTER — Other Ambulatory Visit: Payer: Self-pay | Admitting: Family Medicine

## 2015-09-19 DIAGNOSIS — Z1231 Encounter for screening mammogram for malignant neoplasm of breast: Secondary | ICD-10-CM

## 2015-10-18 ENCOUNTER — Ambulatory Visit
Admission: RE | Admit: 2015-10-18 | Discharge: 2015-10-18 | Disposition: A | Discharge status: Home / Self Care | Payer: Commercial Managed Care - PPO | Source: Ambulatory Visit | Attending: Family Medicine | Admitting: Family Medicine

## 2015-10-18 DIAGNOSIS — Z1231 Encounter for screening mammogram for malignant neoplasm of breast: Secondary | ICD-10-CM

## 2016-09-08 ENCOUNTER — Other Ambulatory Visit: Payer: Self-pay | Admitting: Family Medicine

## 2016-09-08 DIAGNOSIS — Z1231 Encounter for screening mammogram for malignant neoplasm of breast: Secondary | ICD-10-CM

## 2016-10-22 ENCOUNTER — Ambulatory Visit
Admission: RE | Admit: 2016-10-22 | Discharge: 2016-10-22 | Disposition: A | Discharge status: Home / Self Care | Payer: Commercial Managed Care - PPO | Source: Ambulatory Visit | Attending: Family Medicine | Admitting: Family Medicine

## 2016-10-22 DIAGNOSIS — Z1231 Encounter for screening mammogram for malignant neoplasm of breast: Secondary | ICD-10-CM

## 2018-11-15 DIAGNOSIS — E785 Hyperlipidemia, unspecified: Secondary | ICD-10-CM | POA: Insufficient documentation

## 2018-11-15 DIAGNOSIS — E1165 Type 2 diabetes mellitus with hyperglycemia: Secondary | ICD-10-CM | POA: Diagnosis present

## 2018-11-24 ENCOUNTER — Other Ambulatory Visit: Payer: Self-pay | Admitting: Family Medicine

## 2018-11-24 DIAGNOSIS — Z1231 Encounter for screening mammogram for malignant neoplasm of breast: Secondary | ICD-10-CM

## 2019-08-05 ENCOUNTER — Encounter (HOSPITAL_COMMUNITY): Payer: Self-pay | Admitting: Emergency Medicine

## 2019-08-05 ENCOUNTER — Inpatient Hospital Stay (HOSPITAL_COMMUNITY)
Admission: EM | Admit: 2019-08-05 | Discharge: 2019-08-07 | DRG: 202 | Disposition: A | Discharge status: Home / Self Care | Payer: Commercial Managed Care - PPO | Attending: Internal Medicine | Admitting: Internal Medicine

## 2019-08-05 ENCOUNTER — Emergency Department (HOSPITAL_COMMUNITY): Payer: Commercial Managed Care - PPO

## 2019-08-05 ENCOUNTER — Other Ambulatory Visit: Payer: Self-pay

## 2019-08-05 DIAGNOSIS — R0902 Hypoxemia: Secondary | ICD-10-CM | POA: Diagnosis present

## 2019-08-05 DIAGNOSIS — E0965 Drug or chemical induced diabetes mellitus with hyperglycemia: Secondary | ICD-10-CM

## 2019-08-05 DIAGNOSIS — I10 Essential (primary) hypertension: Secondary | ICD-10-CM

## 2019-08-05 DIAGNOSIS — Z91018 Allergy to other foods: Secondary | ICD-10-CM

## 2019-08-05 DIAGNOSIS — E872 Acidosis: Secondary | ICD-10-CM | POA: Diagnosis not present

## 2019-08-05 DIAGNOSIS — Z79899 Other long term (current) drug therapy: Secondary | ICD-10-CM | POA: Diagnosis not present

## 2019-08-05 DIAGNOSIS — B9789 Other viral agents as the cause of diseases classified elsewhere: Secondary | ICD-10-CM | POA: Diagnosis present

## 2019-08-05 DIAGNOSIS — Z7951 Long term (current) use of inhaled steroids: Secondary | ICD-10-CM | POA: Diagnosis not present

## 2019-08-05 DIAGNOSIS — Z20828 Contact with and (suspected) exposure to other viral communicable diseases: Secondary | ICD-10-CM | POA: Diagnosis present

## 2019-08-05 DIAGNOSIS — R0603 Acute respiratory distress: Secondary | ICD-10-CM | POA: Diagnosis present

## 2019-08-05 DIAGNOSIS — Z7984 Long term (current) use of oral hypoglycemic drugs: Secondary | ICD-10-CM

## 2019-08-05 DIAGNOSIS — E1165 Type 2 diabetes mellitus with hyperglycemia: Secondary | ICD-10-CM | POA: Diagnosis present

## 2019-08-05 DIAGNOSIS — Z91013 Allergy to seafood: Secondary | ICD-10-CM | POA: Diagnosis not present

## 2019-08-05 DIAGNOSIS — Z7952 Long term (current) use of systemic steroids: Secondary | ICD-10-CM | POA: Diagnosis not present

## 2019-08-05 DIAGNOSIS — T486X5A Adverse effect of antiasthmatics, initial encounter: Secondary | ICD-10-CM | POA: Diagnosis not present

## 2019-08-05 DIAGNOSIS — J45901 Unspecified asthma with (acute) exacerbation: Secondary | ICD-10-CM | POA: Diagnosis present

## 2019-08-05 LAB — CBC WITH DIFFERENTIAL/PLATELET
Abs Immature Granulocytes: 0.05 10*3/uL (ref 0.00–0.07)
Basophils Absolute: 0 10*3/uL (ref 0.0–0.1)
Basophils Relative: 0 %
Eosinophils Absolute: 0.2 10*3/uL (ref 0.0–0.5)
Eosinophils Relative: 3 %
HCT: 40.6 % (ref 36.0–46.0)
Hemoglobin: 13.3 g/dL (ref 12.0–15.0)
Immature Granulocytes: 1 %
Lymphocytes Relative: 17 %
Lymphs Abs: 1.1 10*3/uL (ref 0.7–4.0)
MCH: 30.1 pg (ref 26.0–34.0)
MCHC: 32.8 g/dL (ref 30.0–36.0)
MCV: 91.9 fL (ref 80.0–100.0)
Monocytes Absolute: 0.5 10*3/uL (ref 0.1–1.0)
Monocytes Relative: 7 %
Neutro Abs: 4.9 10*3/uL (ref 1.7–7.7)
Neutrophils Relative %: 72 %
Platelets: 231 10*3/uL (ref 150–400)
RBC: 4.42 MIL/uL (ref 3.87–5.11)
RDW: 13 % (ref 11.5–15.5)
WBC: 6.8 10*3/uL (ref 4.0–10.5)
nRBC: 0 % (ref 0.0–0.2)

## 2019-08-05 LAB — PROCALCITONIN: Procalcitonin: <0.1 ng/mL

## 2019-08-05 LAB — RESPIRATORY PANEL BY PCR

## 2019-08-05 LAB — BASIC METABOLIC PANEL
Anion gap: 13 (ref 5–15)
BUN: 8 mg/dL (ref 6–20)
CO2: 26 mmol/L (ref 22–32)
Calcium: 9 mg/dL (ref 8.9–10.3)
Chloride: 100 mmol/L (ref 98–111)
Creatinine, Ser: 0.79 mg/dL (ref 0.44–1.00)
GFR calc Af Amer: >60 mL/min (ref >60)
GFR calc non Af Amer: >60 mL/min (ref >60)
Glucose, Bld: 223 mg/dL — ABNORMAL HIGH (ref 70–99)
Potassium: 3.4 mmol/L — ABNORMAL LOW (ref 3.5–5.1)
Sodium: 139 mmol/L (ref 135–145)

## 2019-08-05 LAB — I-STAT BETA HCG BLOOD, ED (MC, WL, AP ONLY): I-stat hCG, quantitative: <5 m[IU]/mL (ref <5)

## 2019-08-05 LAB — POCT I-STAT 7, (LYTES, BLD GAS, ICA,H+H)
Acid-base deficit: 3 mmol/L — ABNORMAL HIGH (ref 0.0–2.0)
Bicarbonate: 19.7 mmol/L — ABNORMAL LOW (ref 20.0–28.0)
Calcium, Ion: 1.12 mmol/L — ABNORMAL LOW (ref 1.15–1.40)
HCT: 39 % (ref 36.0–46.0)
Hemoglobin: 13.3 g/dL (ref 12.0–15.0)
O2 Saturation: 95 %
Patient temperature: 98.6
Potassium: 3.6 mmol/L (ref 3.5–5.1)
Sodium: 137 mmol/L (ref 135–145)
TCO2: 21 mmol/L — ABNORMAL LOW (ref 22–32)
pCO2 arterial: 28.9 mmHg — ABNORMAL LOW (ref 32.0–48.0)
pH, Arterial: 7.441 (ref 7.350–7.450)
pO2, Arterial: 73 mmHg — ABNORMAL LOW (ref 83.0–108.0)

## 2019-08-05 LAB — C-REACTIVE PROTEIN: CRP: 3.4 mg/dL — ABNORMAL HIGH (ref <1.0)

## 2019-08-05 LAB — LACTIC ACID, PLASMA
Lactic Acid, Venous: 2.5 mmol/L (ref 0.5–1.9)
Lactic Acid, Venous: 2.6 mmol/L (ref 0.5–1.9)
Lactic Acid, Venous: 5.7 mmol/L (ref 0.5–1.9)
Lactic Acid, Venous: 5.8 mmol/L (ref 0.5–1.9)

## 2019-08-05 LAB — PHOSPHORUS: Phosphorus: 1.8 mg/dL — ABNORMAL LOW (ref 2.5–4.6)

## 2019-08-05 LAB — SEDIMENTATION RATE: Sed Rate: 19 mm/hr (ref 0–22)

## 2019-08-05 LAB — HIV ANTIBODY (ROUTINE TESTING W REFLEX): HIV Screen 4th Generation wRfx: NONREACTIVE

## 2019-08-05 LAB — MAGNESIUM: Magnesium: 2.1 mg/dL (ref 1.7–2.4)

## 2019-08-05 LAB — CBG MONITORING, ED
Glucose-Capillary: 357 mg/dL — ABNORMAL HIGH (ref 70–99)
Glucose-Capillary: 311 mg/dL — ABNORMAL HIGH (ref 70–99)

## 2019-08-05 LAB — FERRITIN: Ferritin: 62 ng/mL (ref 11–307)

## 2019-08-05 LAB — SARS CORONAVIRUS 2 (TAT 6-24 HRS): SARS Coronavirus 2: NEGATIVE

## 2019-08-05 MED ORDER — ACETAMINOPHEN 325 MG PO TABS: 650.0000 mg | ORAL_TABLET | Freq: Four times a day (QID) | ORAL | Status: DC | PRN

## 2019-08-05 MED ORDER — SODIUM CHLORIDE 0.9% FLUSH
3.0000 mL | Freq: Two times a day (BID) | INTRAVENOUS | Status: DC
  Administered 2019-08-05 – 2019-08-07 (×5): 3 mL via INTRAVENOUS

## 2019-08-05 MED ORDER — HYDROCHLOROTHIAZIDE 12.5 MG PO CAPS
12.5000 mg | ORAL_CAPSULE | Freq: Every day | ORAL | Status: DC
  Administered 2019-08-06 – 2019-08-07 (×2): 12.5 mg via ORAL
  Filled 2019-08-05 (×2): qty 1

## 2019-08-05 MED ORDER — IPRATROPIUM-ALBUTEROL 0.5-2.5 (3) MG/3ML IN SOLN
3.0000 mL | Freq: Once | RESPIRATORY_TRACT | Status: AC
  Administered 2019-08-05: 20:00:00 3 mL via RESPIRATORY_TRACT
  Filled 2019-08-05: qty 3

## 2019-08-05 MED ORDER — ALBUTEROL SULFATE (2.5 MG/3ML) 0.083% IN NEBU
2.5000 mg | INHALATION_SOLUTION | Freq: Four times a day (QID) | RESPIRATORY_TRACT | Status: DC | PRN
  Administered 2019-08-05 – 2019-08-06 (×2): 2.5 mg via RESPIRATORY_TRACT
  Filled 2019-08-05 (×2): qty 3

## 2019-08-05 MED ORDER — PREDNISONE 20 MG PO TABS
40.0000 mg | ORAL_TABLET | Freq: Every day | ORAL | Status: DC
  Administered 2019-08-06 – 2019-08-07 (×2): 40 mg via ORAL
  Filled 2019-08-05 (×2): qty 2

## 2019-08-05 MED ORDER — ALBUTEROL SULFATE HFA 108 (90 BASE) MCG/ACT IN AERS
8.0000 | INHALATION_SPRAY | Freq: Once | RESPIRATORY_TRACT | Status: AC
  Administered 2019-08-05: 15:00:00 8 via RESPIRATORY_TRACT

## 2019-08-05 MED ORDER — ENOXAPARIN SODIUM 40 MG/0.4ML ~~LOC~~ SOLN
40.0000 mg | SUBCUTANEOUS | Status: DC
  Administered 2019-08-05 – 2019-08-06 (×2): 40 mg via SUBCUTANEOUS
  Filled 2019-08-05 (×2): qty 0.4

## 2019-08-05 MED ORDER — MAGNESIUM SULFATE 2 GM/50ML IV SOLN
2.0000 g | Freq: Once | INTRAVENOUS | Status: AC
  Administered 2019-08-05: 2 g via INTRAVENOUS
  Filled 2019-08-05: qty 50

## 2019-08-05 MED ORDER — IPRATROPIUM-ALBUTEROL 0.5-2.5 (3) MG/3ML IN SOLN
3.0000 mL | Freq: Four times a day (QID) | RESPIRATORY_TRACT | Status: DC
  Administered 2019-08-06: 3 mL via RESPIRATORY_TRACT
  Filled 2019-08-05 (×2): qty 3

## 2019-08-05 MED ORDER — METHYLPREDNISOLONE SODIUM SUCC 125 MG IJ SOLR
125.0000 mg | Freq: Once | INTRAMUSCULAR | Status: AC
  Administered 2019-08-05: 12:00:00 125 mg via INTRAVENOUS
  Filled 2019-08-05: qty 2

## 2019-08-05 MED ORDER — MOMETASONE FURO-FORMOTEROL FUM 200-5 MCG/ACT IN AERO: 2.0000 | INHALATION_SPRAY | Freq: Two times a day (BID) | RESPIRATORY_TRACT | Status: DC

## 2019-08-05 MED ORDER — SODIUM CHLORIDE 0.9 % IV SOLN
500.0000 mg | Freq: Once | INTRAVENOUS | Status: AC
  Administered 2019-08-05: 500 mg via INTRAVENOUS
  Filled 2019-08-05: qty 500

## 2019-08-05 MED ORDER — SODIUM CHLORIDE 0.9% FLUSH: 3.0000 mL | INTRAVENOUS | Status: DC | PRN

## 2019-08-05 MED ORDER — IRBESARTAN 75 MG PO TABS
75.0000 mg | ORAL_TABLET | Freq: Every day | ORAL | Status: DC
  Administered 2019-08-06 – 2019-08-07 (×2): 75 mg via ORAL
  Filled 2019-08-05 (×2): qty 1

## 2019-08-05 MED ORDER — ALBUTEROL SULFATE HFA 108 (90 BASE) MCG/ACT IN AERS
8.0000 | INHALATION_SPRAY | Freq: Once | RESPIRATORY_TRACT | Status: AC
  Administered 2019-08-05: 12:00:00 8 via RESPIRATORY_TRACT
  Filled 2019-08-05: qty 6.7

## 2019-08-05 MED ORDER — OLMESARTAN MEDOXOMIL-HCTZ 20-12.5 MG PO TABS: 1.0000 | ORAL_TABLET | Freq: Every evening | ORAL | Status: DC

## 2019-08-05 MED ORDER — SODIUM CHLORIDE 0.9 % IV BOLUS
500.0000 mL | Freq: Once | INTRAVENOUS | Status: AC
  Administered 2019-08-05: 500 mL via INTRAVENOUS

## 2019-08-05 MED ORDER — SODIUM CHLORIDE 0.9% FLUSH: 3.0000 mL | Freq: Two times a day (BID) | INTRAVENOUS | Status: DC

## 2019-08-05 MED ORDER — MOMETASONE FURO-FORMOTEROL FUM 200-5 MCG/ACT IN AERO
2.0000 | INHALATION_SPRAY | Freq: Two times a day (BID) | RESPIRATORY_TRACT | Status: DC
  Administered 2019-08-06 – 2019-08-07 (×3): 2 via RESPIRATORY_TRACT
  Filled 2019-08-05: qty 8.8

## 2019-08-05 MED ORDER — INSULIN ASPART 100 UNIT/ML ~~LOC~~ SOLN
0.0000 [IU] | SUBCUTANEOUS | Status: DC
  Administered 2019-08-05: 20:00:00 9 [IU] via SUBCUTANEOUS
  Administered 2019-08-06 (×2): 3 [IU] via SUBCUTANEOUS
  Administered 2019-08-06 (×2): 5 [IU] via SUBCUTANEOUS
  Administered 2019-08-06 – 2019-08-07 (×2): 3 [IU] via SUBCUTANEOUS
  Administered 2019-08-07: 05:00:00 2 [IU] via SUBCUTANEOUS
  Administered 2019-08-07: 5 [IU] via SUBCUTANEOUS
  Administered 2019-08-07: 09:00:00 2 [IU] via SUBCUTANEOUS

## 2019-08-05 MED ORDER — AEROCHAMBER PLUS FLO-VU LARGE MISC
1.0000 | Freq: Once | Status: AC
  Administered 2019-08-05: 1

## 2019-08-05 MED ORDER — SODIUM CHLORIDE 0.9 % IV SOLN
1.0000 g | Freq: Once | INTRAVENOUS | Status: AC
  Administered 2019-08-05: 15:00:00 1 g via INTRAVENOUS
  Filled 2019-08-05: qty 10

## 2019-08-05 MED ORDER — SENNOSIDES-DOCUSATE SODIUM 8.6-50 MG PO TABS: 1.0000 | ORAL_TABLET | Freq: Every evening | ORAL | Status: DC | PRN

## 2019-08-05 NOTE — H&P (Signed)
Date: 08/05/2019               Patient Name:  [name] MRN: [id number]  DOB: June 21, 1971 Age / Sex: 48 y.o., female   PCP: Chesley Noon, MD         Medical Service: Internal Medicine Teaching Service         Attending Physician: Dr. Evette Doffing, Mallie Mussel, *    First Contact: Dr. Gilford Rile Pager: [phone]  Second Contact: Dr. Myrtie Hawk Pager: [phone]       After Hours (After 5p/  First Contact Pager: [phone]  weekends / holidays): Second Contact Pager: [phone]   Chief Complaint: [facility]  History of Present Illness:  Ms. [name] is a 48 y/o female, with a PMH of asthma and hypertension, who presents to [facility] with shortness of breath. She associates the start of her symptoms after meeting her daughter, who had a, "pretty bad, snotty cold" on 07/31/2019. She states that her prodromal symptoms were similar to her daughters. She had nasal congestion on 08/02/2019, which progressed to dyspnea on the evening of 08/03/2019. She states that over the past evening, despite multiple doses of her home albuterol she could not alleviate her symptoms. She states that she feels like, "a band" had been placed around her chest and would not allow her to take a full breath. Sitting up straight alleviates her symptoms, while laying flat and walking exacerbate her symptoms. She denies travelling long distances in the past two months, and has been active in her home due to moving houses. She endorses myalgias, loose stools, and nasal congestion. She denies fevers, nausea, vomiting, chest pain, leg pain.   During her ED course it was found that she was hypokalemic (K: 3.4), lactic acidotic (LA: 2.5), with a SARS COVID 19 test pending.   Meds:  Current Meds  Medication Sig  . acetaminophen (TYLENOL) 500 MG tablet Take 1,000 mg by mouth daily as needed (for headache).  Marland Kitchen albuterol (PROVENTIL) (2.5 MG/3ML) 0.083% nebulizer solution Take 2.5 mg by nebulization every 6 (six) hours as needed for  wheezing.  Marland Kitchen albuterol (VENTOLIN HFA) 108 (90 Base) MCG/ACT inhaler Inhale 1-2 puffs into the lungs every 6 (six) hours as needed for wheezing or shortness of breath.  . Fluticasone-Salmeterol (ADVAIR) 500-50 MCG/DOSE AEPB Inhale 1 puff into the lungs every evening.  . meloxicam (MOBIC) 15 MG tablet Take 15 mg by mouth daily.  . metFORMIN (GLUCOPHAGE) 500 MG tablet Take 1,000 mg by mouth 2 (two) times daily with a meal.  . olmesartan-hydrochlorothiazide (BENICAR HCT) 20-12.5 MG per tablet Take 1 tablet by mouth every evening.  . Pseudoephedrine-APAP-DM (DAYQUIL PO) Take 1 tablet by mouth daily as needed (for cold).     Allergies: Allergies as of 08/05/2019 - Review Complete 08/05/2019  Allergen Reaction Noted  . Shellfish allergy Swelling 04/15/2013  . Watermelon [citrullus vulgaris] Swelling 04/15/2013   Past Medical History:  Diagnosis Date  . Asthma   . Hypertension     Family History: Patient was adopted as a young girl and does not know her family history.   Social History:  Patient denies tobacco use and illicit drug usage.  Patient states she has 1-2 EtOH drinks a week.   Review of Systems: A complete ROS was negative except as per HPI.  Physical Exam: Blood pressure 132/61, pulse 93, temperature 98.6 F (37 C), resp. rate 14, SpO2 93 %.  Physical Exam Vitals signs and nursing note reviewed.  Constitutional:  General: She is in acute distress.     Comments: Patient sitting straight up in bed. Unable to speak more than 3-5 words at a time. Appears anxious and in distress.   HENT:     Head: Normocephalic and atraumatic.  Cardiovascular:     Rate and Rhythm: Normal rate and regular rhythm.     Pulses: Normal pulses.     Heart sounds: Normal heart sounds. No murmur. No friction rub. No gallop.   Pulmonary:     Breath sounds: No stridor. Wheezing present. No rhonchi.     Comments: Tachypneic, RR 24-27 during interview, Wheezing auscultated right and left upper  lobes. Decreased breath sounds noted at the right lung base.  Abdominal:     General: Bowel sounds are normal.     Palpations: Abdomen is soft.     Tenderness: There is no abdominal tenderness. There is no guarding.  Musculoskeletal:     Right lower leg: No edema.     Left lower leg: No edema.  Skin:    General: Skin is warm and dry.  Neurological:     General: No focal deficit present.     Mental Status: She is oriented to person, place, and time.  Psychiatric:     Comments: Anxious appearing.     EKG: personally reviewed my interpretation is normal sinus rhythm.  CXR: personally reviewed my interpretation is no acute cardiopulmonary disease.  Assessment & Plan by Problem: Active Problems:   Hypoxia  Assessment: [name] is a 48 y/o female, with a PMH of HTN and asthma, who presents to the ED with Hypoxia likely 2/2 to asthma exacerbation due to an underlying infectious etiology.   She states that she has had asthma exacerbations before, and when she has had upper respiratory symptoms she can "weather them out." She did have a recent contact exposure with her daughter this past Monday. Patient appears to have similar prodromal period as her daughter, with nasal congestion several days after her exposure. Per the patient, she stays at home and isolates herself due to her comorbidities of asthma and weight during the COVID pandemic. Her blood work shows no leukocytosis or elevated temperature and her imaging does not show loculations or consolidation that may be suggestive of bacterial etiology. While a possible pulmonary embolism is possible it is low on the differential given that the patient has not travelled great distances recently, has been actively moving, and a Well's score of 1.5. She will continue to be monitored.   Plan:  Hypoxia 2/2 Respiratory Illness on Asthma Exacerbation: - Patient comes to the ED with hypoxia which is possibly secondary to asthma exacerbation,  further exacerbated by viral etiology. Her initial exams were negative for fever, leukocytosis, or other infectious etiology.  - COVID: negative - Ordered a RVP - Continue O2 therapy - Albuterol/Atrovent treatment  - Received one dose of azithromycin and ceftriaxone - Diet: NPO - Procalcitonin ordered - Phosphorus ordered - Sedimentation rate ordered  Hypertension:  - Blood pressures were stable coming to the ED 132/61 we will hold his home medications.    Dispo: Admit patient to Inpatient with expected length of stay greater than 2 midnights.  Signed: Dolan Amen, MD 08/05/2019, 4:48 PM  Pager: [phone]

## 2019-08-05 NOTE — ED Triage Notes (Signed)
Pt reports asthma exacerbation for several days that got worse today. Using her nebulizer's with no relief. Pt has obvious nasal congestion. Denies exposure to Covid.

## 2019-08-05 NOTE — ED Notes (Signed)
Called Dr. Sheppard Coil about patients lactic acid. Continue to admit to 5W. No orders at this time.

## 2019-08-05 NOTE — ED Notes (Signed)
Contacted admitting provider coverage about call from patient placement. Dr Myrtie Hawk will call to sort out their request.

## 2019-08-05 NOTE — ED Provider Notes (Signed)
[facility] EMERGENCY DEPARTMENT Provider Note   CSN: [id number] Arrival date & time: 08/05/19  1133     History   Chief Complaint Chief Complaint  Patient presents with  . Asthma    HPI [name] is a 48 y.o. female with history of hypertension, asthma, obesity presents to the ER for evaluation of shortness of breath began last night that has rapidly progressed.  Initially on exertion but now constant. She had to sit up straight to sleep last night because movement and laying flat made her more short of breath.  Reports associated body aches, productive cough, chest discomfort, nasal congestion.  Her daughter came to visit her on Monday and she was just getting over a "cold".  She was feeling well up until yesterday morning when she woke up feeling like she was going to get sick.  At baseline she has rare issues with her asthma and only needs an albuterol rescue inhaler as needed.  Over the last 48 hours she has used numerous nebulizing treatments without any relief.  Reports many years ago when she lived in [place] she had recurrent asthma flares and had to go to the ER a lot but she has never required hospitalization, intubations.  No known cardiac disease.  No history of tobacco use.  Denies fever, headaches, sore throat, nausea, vomiting, diarrhea, abdominal pain.  No lower extremity swelling or calf pain.     HPI  Past Medical History:  Diagnosis Date  . Asthma   . Hypertension     Patient Active Problem List   Diagnosis Date Noted  . Hypoxia 08/05/2019  . Respiratory distress 08/05/2019  . Asthma exacerbation 08/05/2019  . Closed trimalleolar fracture of left ankle 11/30/2012    Past Surgical History:  Procedure Laterality Date  . CESAREAN SECTION  2000  . FIBULA FRACTURE SURGERY  2008   right leg  . ORIF ANKLE FRACTURE Left 12/01/2012   Procedure: OPEN REDUCTION INTERNAL FIXATION (ORIF) ANKLE FRACTURE;  Surgeon: Eldred Manges, MD;  Location:  [facility] OR;  Service: Orthopedics;  Laterality: Left;  . ORIF ANKLE FRACTURE BIMALLEOLAR  12/01/2012   Dr Ophelia Charter  . RETINAL DETACHMENT SURGERY  1999   OD     OB History   No obstetric history on file.      Home Medications    Prior to Admission medications   Medication Sig Start Date End Date Taking? Authorizing Provider  acetaminophen (TYLENOL) 500 MG tablet Take 1,000 mg by mouth daily as needed (for headache).   Yes [provider]  albuterol (PROVENTIL) (2.5 MG/3ML) 0.083% nebulizer solution Take 2.5 mg by nebulization every 6 (six) hours as needed for wheezing.   Yes [provider]  albuterol (VENTOLIN HFA) 108 (90 Base) MCG/ACT inhaler Inhale 1-2 puffs into the lungs every 6 (six) hours as needed for wheezing or shortness of breath.   Yes [provider]  Fluticasone-Salmeterol (ADVAIR) 500-50 MCG/DOSE AEPB Inhale 1 puff into the lungs every evening.   Yes [provider]  meloxicam (MOBIC) 15 MG tablet Take 15 mg by mouth daily. 11/21/18  Yes [provider]  metFORMIN (GLUCOPHAGE) 500 MG tablet Take 1,000 mg by mouth 2 (two) times daily with a meal.   Yes [provider]  olmesartan-hydrochlorothiazide (BENICAR HCT) 20-12.5 MG per tablet Take 1 tablet by mouth every evening.   Yes [provider]  Pseudoephedrine-APAP-DM (DAYQUIL PO) Take 1 tablet by mouth daily as needed (for cold).  Yes [provider]    Family History No family history on file.  Social History Social History   Tobacco Use  . Smoking status: Never Smoker  . Smokeless tobacco: Never Used  Substance Use Topics  . Alcohol use: Yes    Comment: occ  . Drug use: No     Allergies   Shellfish allergy and Watermelon [citrullus vulgaris]   Review of Systems Review of Systems  HENT: Positive for congestion.   Respiratory: Positive for cough, chest tightness and shortness of breath.   Musculoskeletal: Positive for myalgias.  All other  systems reviewed and are negative.    Physical Exam Updated Vital Signs BP (!) 120/57   Pulse (!) 114   Temp 98.6 F (37 C)   Resp (!) 27   SpO2 96%   Physical Exam Vitals signs and nursing note reviewed.  Constitutional:      General: She is in acute distress.     Appearance: She is well-developed.     Comments: Mild distress, appears slightly anxious.  HENT:     Head: Normocephalic and atraumatic.     Right Ear: External ear normal.     Left Ear: External ear normal.     Nose: Nose normal.  Eyes:     Conjunctiva/sclera: Conjunctivae normal.  Neck:     Musculoskeletal: Normal range of motion and neck supple.  Cardiovascular:     Rate and Rhythm: Normal rate and regular rhythm.     Heart sounds: Normal heart sounds.     Comments: 1+ radial and DP pulses bilaterally.  No lower extremity edema.  No calf tenderness. Pulmonary:     Effort: Respiratory distress present.     Breath sounds: Wheezing present.     Comments: Speaking in 2-3 word sentences.  Mild/moderate respiratory distress.  Minimal air entry in the middle and lower lobes, faint end expiratory wheezing in upper lobes.  SPO2 is 93% on room air when I enter the room.  I ambulated patient at bedside for less than 1 minute and she had to stop and rest due to shortness of breath.  SPO2 dropped to 90% during walk.  Tachypneic, speaking in 2-3 word sentences.  SPO2 93% and greater after a couple of minutes of rest. Musculoskeletal: Normal range of motion.        General: No deformity.  Skin:    General: Skin is warm and dry.     Capillary Refill: Capillary refill takes less than 2 seconds.  Neurological:     Mental Status: She is alert and oriented to person, place, and time.  Psychiatric:        Behavior: Behavior normal.        Thought Content: Thought content normal.        Judgment: Judgment normal.      ED Treatments / Results  Labs (all labs ordered are listed, but only abnormal results are displayed)  Labs Reviewed  BASIC METABOLIC PANEL - Abnormal; Notable for the following components:      Result Value   Potassium 3.4 (*)    Glucose, Bld 223 (*)    All other components within normal limits  LACTIC ACID, PLASMA - Abnormal; Notable for the following components:   Lactic Acid, Venous 2.5 (*)    All other components within normal limits  LACTIC ACID, PLASMA - Abnormal; Notable for the following components:   Lactic Acid, Venous 2.6 (*)    All other components within normal limits  LACTIC ACID, PLASMA - Abnormal; Notable for the following components:   Lactic Acid, Venous 5.7 (*)    All other components within normal limits  SARS CORONAVIRUS 2 (TAT 6-24 HRS)  RESPIRATORY PANEL BY PCR  CBC WITH DIFFERENTIAL/PLATELET  HIV ANTIBODY (ROUTINE TESTING W REFLEX)  HIV4GL SAVE TUBE  LACTIC ACID, PLASMA  CBC  COMPREHENSIVE METABOLIC PANEL  FERRITIN  PHOSPHORUS  MAGNESIUM  C-REACTIVE PROTEIN  SEDIMENTATION RATE  PROCALCITONIN  HEMOGLOBIN A1C  BLOOD GAS, ARTERIAL  I-STAT BETA HCG BLOOD, ED ([facility], [facility], [facility] ONLY)    EKG EKG Interpretation  Date/Time:  Saturday August 05 2019 12:20:35 EDT Ventricular Rate:  92 PR Interval:    QRS Duration: 88 QT Interval:  370 QTC Calculation: 458 R Axis:   53 Text Interpretation:  Sinus rhythm No STEMI  Confirmed by Alvester Chourifan, Matthew [phone]([id number]) on 08/05/2019 12:23:06 PM   Radiology Dg Chest Port 1 View  Result Date: 08/05/2019 CLINICAL DATA:  Shortness of breath, wheezing, possible COVID exposure EXAM: PORTABLE CHEST 1 VIEW COMPARISON:  10/05/2010 FINDINGS: Lungs are clear.  No pleural effusion or pneumothorax. The heart is normal in size. IMPRESSION: No evidence of acute cardiopulmonary disease. Electronically Signed   By: Charline BillsSriyesh  Krishnan M.D.   On: 08/05/2019 12:39    Procedures .Critical Care Performed by: Liberty HandyGibbons, Zavannah Deblois J, PA-C Authorized by: Liberty HandyGibbons, Xavier Munger J, PA-C   Critical care provider statement:    Critical care time (minutes):   45   Critical care was necessary to treat or prevent imminent or life-threatening deterioration of the following conditions:  Sepsis and respiratory failure   Critical care was time spent personally by me on the following activities:  Discussions with consultants, evaluation of patient's response to treatment, examination of patient, ordering and performing treatments and interventions, ordering and review of laboratory studies, ordering and review of radiographic studies, pulse oximetry, re-evaluation of patient's condition, obtaining history from patient or surrogate, review of old charts and development of treatment plan with patient or surrogate   I assumed direction of critical care for this patient from another provider in my specialty: no     (including critical care time)  Medications Ordered in ED Medications  enoxaparin (LOVENOX) injection 40 mg (40 mg Subcutaneous Given 08/05/19 1635)  sodium chloride flush (NS) 0.9 % injection 3 mL (has no administration in time range)  acetaminophen (TYLENOL) tablet 650 mg (has no administration in time range)  senna-docusate (Senokot-S) tablet 1 tablet (has no administration in time range)  insulin aspart (novoLOG) injection 0-9 Units (has no administration in time range)  albuterol (PROVENTIL) (2.5 MG/3ML) 0.083% nebulizer solution 2.5 mg (has no administration in time range)  predniSONE (DELTASONE) tablet 40 mg (has no administration in time range)  ipratropium-albuterol (DUONEB) 0.5-2.5 (3) MG/3ML nebulizer solution 3 mL (3 mLs Nebulization Not Given 08/05/19 1943)  mometasone-formoterol (DULERA) 200-5 MCG/ACT inhaler 2 puff (2 puffs Inhalation Not Given 08/05/19 1943)  irbesartan (AVAPRO) tablet 75 mg (has no administration in time range)    And  hydrochlorothiazide (MICROZIDE) capsule 12.5 mg (has no administration in time range)  magnesium sulfate IVPB 2 g 50 mL (0 g Intravenous Stopped 08/05/19 1412)  methylPREDNISolone sodium succinate  (SOLU-MEDROL) 125 mg/2 mL injection 125 mg (125 mg Intravenous Given 08/05/19 1221)  albuterol (VENTOLIN HFA) 108 (90 Base) MCG/ACT inhaler 8 puff (8 puffs Inhalation Given 08/05/19 1226)  AeroChamber Plus Flo-Vu Large MISC 1 each (1 each Other Given 08/05/19 1433)  albuterol (VENTOLIN HFA) 108 (90 Base) MCG/ACT  inhaler 8 puff (8 puffs Inhalation Given 08/05/19 1448)  cefTRIAXone (ROCEPHIN) 1 g in sodium chloride 0.9 % 100 mL IVPB (0 g Intravenous Stopped 08/05/19 1521)  azithromycin (ZITHROMAX) 500 mg in sodium chloride 0.9 % 250 mL IVPB (0 mg Intravenous Stopped 08/05/19 1553)  sodium chloride 0.9 % bolus 500 mL (0 mLs Intravenous Stopped 08/05/19 1622)  ipratropium-albuterol (DUONEB) 0.5-2.5 (3) MG/3ML nebulizer solution 3 mL (3 mLs Nebulization Given 08/05/19 1933)     Initial Impression / Assessment and Plan / ED Course  I have reviewed the triage vital signs and the nursing notes.  Pertinent labs & imaging results that were available during my care of the patient were reviewed by me and considered in my medical decision making (see chart for details).  Clinical Course as of Aug 04 1956  Sat Aug 05, 2019  1321 WBC: 6.8 [CG]  1321 Afebrile, no tachycardia, hypotension.   Lactic Acid, Venous(!!): 2.5 [CG]  1346 Reevaluated patient.  SPO2 is 90% when I enter the room.  She is not on supplemental oxygen.  I placed patient on 3 L [place] with SPO2 993 to 94%.  She has done 8 puffs of albuterol inhaler without a spacer.  Repeat auscultation does not reveal significant improvement in air sounds.  I have asked RN to please get nebulizing and repeat treatment.   [CG]  1429 Patient was seen by myself as well as PA provider.  Briefly this is a 48 year old female with a history of asthma presented to the emergency department shortness of breath and wheezing.  She reports that her daughter was visiting her 5 days ago, and that her daughter had URI symptoms at that time.  The patient reports that she  herself developed shortness of breath and coughing yesterday.  She feels generalized malaise and muscle aches.  She has been taking her albuterol nebulizer medication at home around-the-clock with no avail.  She presents to the ED mild respiratory distress.  She is satting 88 to 89% on room air.  Her respiratory rate was within normal limits.  She is able to speak in full sentences but does appear tired after speaking.  She has mild accessory muscle usage.  She has diffuse bilateral end expiratory wheezing.  Her chest x-ray shows bilateral patchy infiltrates.  Clinically this is most consistent with Covid pneumonia.  She does not have signs or symptoms of bacterial sepsis.  Her white blood cell count is normal.  Do not believe she needs IV antibiotics.   [MT]  1431 She was given IV magnesium as well as albuterol pump.  She cannot get duo nebs until her Covid results are back per the respiratory protocol, I am told.   [MT]  1431 Suspect this patient will need admission.   [MT]  1431 This note was dictated using dragon dictation software.  Please be aware that there may be minor translation errors as a result of this oral dictation   [MT]    Clinical Course User Index [CG] Liberty Handy, PA-C [MT] Renaye Rakers, Kermit Balo, MD   48 year old female here for shortness of breath.  Associated myalgias, congestion, body aches, productive cough.  Possible exposure to sick contact on Monday with "a cold".  History of recurrent asthma exacerbations several years ago, now better controlled.  Has never required hospitalization or intubation or supplemental oxygen.  She is hypoxic with SPO2 89 to 90% on room air at rest, tachypneic.  Mild/moderate respiratory distress speaking in 2-3 word sentences.  Afebrile.  Normal blood pressure.  ER work-up reviewed by me remarkable for lactic acid 2.5.  She has no leukocytosis, fever, tachycardia.  Chest x-ray is negative.  EKG with out ischemic changes, right heart strain.   COVID-19 test is pending.  High on DDX is viral URI/LRI causing asthma exacerbation and hypoxia, early community-acquired pneumonia.  I considered new onset heart failure unlikely, she has no heart disease, known HF and she does not appear hypervolemic on exam.  Given other infectious symptomatology and possible sick contact PE, ACS is unlikely.    She was given albuterol inhaler 8 puffs x 2, methylprednisone, magnesium sulfate in the ER.  I reevaluated patient and has had no improvement in oxygen saturation, still 90% on room air at rest.  She does not feel any better.  No clinical improvement despite medicines.  She meets SIRS and sepsis criteria with HR, RR, suspected source of infection likely viral in natura/COVID.  Severe sepsis due to lactic 2.5.  Will give one dose of antibiotics in ER. Given hypoxia and normal BP, will use judicious IVF.   Will discuss with hospitalist team for admission.  COVID-19 test is pending. Final Clinical Impressions(s) / ED Diagnoses   Final diagnoses:  Hypoxemia    ED Discharge Orders    None       Arlean Hopping 08/05/19 1957    Wyvonnia Dusky, MD 08/06/19 [phone]

## 2019-08-05 NOTE — Progress Notes (Signed)
I saw the patient after it was reported that she was experiencing chest pain. The patient says that she was having difficulty breathing and her chest felt tight all over. She is able to speak a few words but has to pause to take breaths. She denies having chest pain that radiates. Her main complaint was difficulty breathing. On exam, she is tachycardic but had Normal S1 & S2, no murmurs rubs or gallops. She was diffusely wheezing in all lung fields. It does not appear that she is having an acute cardiac event. EKG from earlier today showed sinus rhythm, an and no evidence of an ischemic process. We will continue to monitor.  Earlene Plater, MD Internal Medicine, PGY1 Pager: [phone]  08/05/2019,8:14 PM

## 2019-08-06 ENCOUNTER — Other Ambulatory Visit: Payer: Self-pay

## 2019-08-06 DIAGNOSIS — R0902 Hypoxemia: Secondary | ICD-10-CM | POA: Diagnosis not present

## 2019-08-06 DIAGNOSIS — Z79899 Other long term (current) drug therapy: Secondary | ICD-10-CM | POA: Diagnosis not present

## 2019-08-06 DIAGNOSIS — I10 Essential (primary) hypertension: Secondary | ICD-10-CM | POA: Diagnosis not present

## 2019-08-06 DIAGNOSIS — J45901 Unspecified asthma with (acute) exacerbation: Secondary | ICD-10-CM | POA: Diagnosis not present

## 2019-08-06 LAB — GLUCOSE, CAPILLARY
Glucose-Capillary: 308 mg/dL — ABNORMAL HIGH (ref 70–99)
Glucose-Capillary: 253 mg/dL — ABNORMAL HIGH (ref 70–99)
Glucose-Capillary: 221 mg/dL — ABNORMAL HIGH (ref 70–99)
Glucose-Capillary: 236 mg/dL — ABNORMAL HIGH (ref 70–99)
Glucose-Capillary: 246 mg/dL — ABNORMAL HIGH (ref 70–99)
Glucose-Capillary: 291 mg/dL — ABNORMAL HIGH (ref 70–99)

## 2019-08-06 LAB — COMPREHENSIVE METABOLIC PANEL
ALT: 24 U/L (ref 0–44)
AST: 20 U/L (ref 15–41)
Albumin: 3.9 g/dL (ref 3.5–5.0)
Alkaline Phosphatase: 67 U/L (ref 38–126)
Anion gap: 15 (ref 5–15)
BUN: 10 mg/dL (ref 6–20)
CO2: 20 mmol/L — ABNORMAL LOW (ref 22–32)
Calcium: 8.9 mg/dL (ref 8.9–10.3)
Chloride: 103 mmol/L (ref 98–111)
Creatinine, Ser: 0.81 mg/dL (ref 0.44–1.00)
GFR calc Af Amer: >60 mL/min (ref >60)
GFR calc non Af Amer: >60 mL/min (ref >60)
Glucose, Bld: 314 mg/dL — ABNORMAL HIGH (ref 70–99)
Potassium: 3.8 mmol/L (ref 3.5–5.1)
Sodium: 138 mmol/L (ref 135–145)
Total Bilirubin: 0.7 mg/dL (ref 0.3–1.2)
Total Protein: 7.5 g/dL (ref 6.5–8.1)

## 2019-08-06 LAB — CBC
HCT: 37.2 % (ref 36.0–46.0)
Hemoglobin: 12.7 g/dL (ref 12.0–15.0)
MCH: 30.5 pg (ref 26.0–34.0)
MCHC: 34.1 g/dL (ref 30.0–36.0)
MCV: 89.2 fL (ref 80.0–100.0)
Platelets: 244 10*3/uL (ref 150–400)
RBC: 4.17 MIL/uL (ref 3.87–5.11)
RDW: 13.4 % (ref 11.5–15.5)
WBC: 8 10*3/uL (ref 4.0–10.5)
nRBC: 0 % (ref 0.0–0.2)

## 2019-08-06 LAB — LACTIC ACID, PLASMA
Lactic Acid, Venous: 2.8 mmol/L (ref 0.5–1.9)
Lactic Acid, Venous: 1.7 mmol/L (ref 0.5–1.9)

## 2019-08-06 MED ORDER — ALBUTEROL SULFATE (2.5 MG/3ML) 0.083% IN NEBU
2.5000 mg | INHALATION_SOLUTION | RESPIRATORY_TRACT | Status: DC
  Filled 2019-08-06: qty 3

## 2019-08-06 MED ORDER — IPRATROPIUM-ALBUTEROL 0.5-2.5 (3) MG/3ML IN SOLN
3.0000 mL | Freq: Three times a day (TID) | RESPIRATORY_TRACT | Status: DC
  Administered 2019-08-06 – 2019-08-07 (×3): 3 mL via RESPIRATORY_TRACT
  Filled 2019-08-06 (×3): qty 3

## 2019-08-06 MED ORDER — ALBUTEROL SULFATE (2.5 MG/3ML) 0.083% IN NEBU: 2.5000 mg | INHALATION_SOLUTION | RESPIRATORY_TRACT | Status: DC | PRN

## 2019-08-06 MED ORDER — LACTATED RINGERS IV BOLUS
1000.0000 mL | Freq: Once | INTRAVENOUS | Status: AC
  Administered 2019-08-06: 1000 mL via INTRAVENOUS

## 2019-08-06 MED ORDER — IPRATROPIUM-ALBUTEROL 0.5-2.5 (3) MG/3ML IN SOLN
3.0000 mL | RESPIRATORY_TRACT | Status: DC
  Administered 2019-08-06: 3 mL via RESPIRATORY_TRACT
  Filled 2019-08-06 (×2): qty 3

## 2019-08-06 NOTE — Progress Notes (Signed)
30 Pt arrived from ED via gurney accompanied by ED staff. Pt very short of breath and required a w/c  for transport to the bed and assistance of two persons. HOB elevated, 02 @ 2L via nasal cannula placed on pt. Skin check done with [facility] RN, no open areas on pt, healed scars to LLE and R inner ankle. Pt says she has 8/10 pain to her lungs and it hurts to breathe. BSC brought to bedside and pt voided 225 ml of amber colored urine. Pt wanted to wash up and requested to sit at the sink to do this- same done. Oriented to floor, room , call bell, bed controls and white board. Will cont to monitor, personal belongings and call bell within reach.

## 2019-08-06 NOTE — Progress Notes (Signed)
   Subjective:  [name] was seen at bedside this morning. She states that she still feels like she has a, "A rusty nail," in her chest when she breaths in and out. She states that she is doing better today than yesterday. We stated that her RVP came back + for rhinovirus and she was relieved she did not have COVID 19. All questions and concerns were addressed.   Objective: Vital signs in last 24 hours: Vitals:   08/06/19 0350 08/06/19 0745 08/06/19 0748 08/06/19 0750  BP:  97/78    Pulse: 94     Resp: 14     Temp: 99.1 F (37.3 C) 98.1 F (36.7 C)    TempSrc: Oral Oral    SpO2: 93%  97% 99%   Physical Exam Vitals signs and nursing note reviewed.  Constitutional:      Appearance: Normal appearance.     Comments: Patient reclining in bed. Appears more comfortable than on admission. Cooperative. Does not appear to be in acute distress.   HENT:     Head: Normocephalic and atraumatic.  Cardiovascular:     Rate and Rhythm: Normal rate and regular rhythm.     Pulses: Normal pulses.     Heart sounds: Normal heart sounds.  Pulmonary:     Effort: Pulmonary effort is normal.     Comments: Wheezes auscultated bilaterally in all lung fields.  Musculoskeletal:     Right lower leg: No edema.     Left lower leg: No edema.  Skin:    General: Skin is warm.  Neurological:     Mental Status: She is alert and oriented to person, place, and time.    Assessment/Plan:  Active Problems:   Hypoxia   Respiratory distress   Asthma exacerbation  Hypoxia 2/2 Respiratory Illness on Asthma Exacerbation: - Patient comes to the ED with hypoxia which is possibly secondary to asthma exacerbation, further exacerbated by viral etiology. Her initial exams were negative for fever, leukocytosis, or other infectious etiology. Chest xray showed no consolidation, opacities, or atelectasis. She is receiving albuterol, and prednisone 40 mg.  - COVID: negative - RVP: + Rhinovirus - Continue O2 therapy -  Albuterol/Atrovent treatment Q4H while awake and PRN at night - Diet: NPO - Procalcitonin: <0.10 - Sedimentation Rate: 19 - Will continue to monitor overnight, attempt ambulation with pulse oximetry tomorrow.   Hypertension:  - Blood pressures were stable coming to the ED 132/61 we will hold her home medications.   Dispo: Anticipated discharge in approximately 1-2 days.   Faith Mercury, MD 08/06/2019, 8:36 AM Pager: [phone]

## 2019-08-07 DIAGNOSIS — J45901 Unspecified asthma with (acute) exacerbation: Secondary | ICD-10-CM | POA: Diagnosis not present

## 2019-08-07 DIAGNOSIS — R0902 Hypoxemia: Secondary | ICD-10-CM | POA: Diagnosis not present

## 2019-08-07 DIAGNOSIS — Z7952 Long term (current) use of systemic steroids: Secondary | ICD-10-CM

## 2019-08-07 DIAGNOSIS — Z7951 Long term (current) use of inhaled steroids: Secondary | ICD-10-CM

## 2019-08-07 DIAGNOSIS — I10 Essential (primary) hypertension: Secondary | ICD-10-CM | POA: Diagnosis not present

## 2019-08-07 LAB — COMPREHENSIVE METABOLIC PANEL
ALT: 20 U/L (ref 0–44)
AST: 18 U/L (ref 15–41)
Albumin: 3.6 g/dL (ref 3.5–5.0)
Alkaline Phosphatase: 61 U/L (ref 38–126)
Anion gap: 11 (ref 5–15)
BUN: 11 mg/dL (ref 6–20)
CO2: 26 mmol/L (ref 22–32)
Calcium: 8.8 mg/dL — ABNORMAL LOW (ref 8.9–10.3)
Chloride: 104 mmol/L (ref 98–111)
Creatinine, Ser: 0.77 mg/dL (ref 0.44–1.00)
GFR calc Af Amer: >60 mL/min (ref >60)
GFR calc non Af Amer: >60 mL/min (ref >60)
Glucose, Bld: 158 mg/dL — ABNORMAL HIGH (ref 70–99)
Potassium: 3.7 mmol/L (ref 3.5–5.1)
Sodium: 141 mmol/L (ref 135–145)
Total Bilirubin: 0.5 mg/dL (ref 0.3–1.2)
Total Protein: 6.9 g/dL (ref 6.5–8.1)

## 2019-08-07 LAB — CBC
HCT: 37.2 % (ref 36.0–46.0)
Hemoglobin: 12.2 g/dL (ref 12.0–15.0)
MCH: 30.2 pg (ref 26.0–34.0)
MCHC: 32.8 g/dL (ref 30.0–36.0)
MCV: 92.1 fL (ref 80.0–100.0)
Platelets: 255 10*3/uL (ref 150–400)
RBC: 4.04 MIL/uL (ref 3.87–5.11)
RDW: 13.5 % (ref 11.5–15.5)
WBC: 10.2 10*3/uL (ref 4.0–10.5)
nRBC: 0 % (ref 0.0–0.2)

## 2019-08-07 LAB — HEMOGLOBIN A1C
Hgb A1c MFr Bld: 8.9 % — ABNORMAL HIGH (ref 4.8–5.6)
Mean Plasma Glucose: 209 mg/dL

## 2019-08-07 LAB — GLUCOSE, CAPILLARY
Glucose-Capillary: 161 mg/dL — ABNORMAL HIGH (ref 70–99)
Glucose-Capillary: 172 mg/dL — ABNORMAL HIGH (ref 70–99)
Glucose-Capillary: 228 mg/dL — ABNORMAL HIGH (ref 70–99)
Glucose-Capillary: 289 mg/dL — ABNORMAL HIGH (ref 70–99)

## 2019-08-07 MED ORDER — PREDNISONE 20 MG PO TABS: ORAL_TABLET | ORAL | 0 refills | Status: AC

## 2019-08-07 NOTE — Progress Notes (Signed)
   08/07/19 0832 08/07/19 0852  Vitals  Pulse Rate  --  (!) 102  ECG Heart Rate (!) 141 (Rihanna Marseille RN notified. )  --   Cardiac Rhythm ST  --   Resp  --  18  PCA/Epidural/Spinal Assessment  Respiratory Pattern  --  Regular;Unlabored  MEWS Score  MEWS RR 0 0  MEWS Pulse 3 1  MEWS Systolic 0 0  MEWS LOC 0 0  MEWS Temp 0 0  MEWS Score 3 1  MEWS Score Color Yellow Green  MEWS Assessment  Is this an acute change? No  --    Nurse alerted by central tele of high HR. Pt. Seen in bed with HR and RR decreasing after getting out of breath from going to the bathroom and brushing teeth. Notified Dr. Ronnald Ramp MD. No new orders at this time.

## 2019-08-07 NOTE — Progress Notes (Signed)
[name] to be D/C'd Home per MD order.  Discussed with the patient and all questions fully answered.  VSS, Skin clean, dry and intact without evidence of skin break down, no evidence of skin tears noted. IV catheter discontinued intact. Site without signs and symptoms of complications. Dressing and pressure applied.  An After Visit Summary was printed and given to the patient. Patient received prescription.  D/c education completed with patient/family including follow up instructions, medication list, d/c activities limitations if indicated, with other d/c instructions as indicated by MD - patient able to verbalize understanding, all questions fully answered.   Patient instructed to return to ED, call 911, or call MD for any changes in condition.   Patient escorted via [place], and D/C home via private auto.  [name] 08/07/2019 5:09 PM

## 2019-08-07 NOTE — Progress Notes (Signed)
   Subjective: Pt up in bed this morning. O2 sat 100 on 2L [place]. States she is feeling much improved, with no acute concerns.  Objective:  Vital signs in last 24 hours: Vitals:   08/06/19 1957 08/06/19 2018 08/07/19 0006 08/07/19 0500  BP:  113/67  136/72  Pulse:    73  Resp:    15  Temp:  97.8 F (36.6 C) 97.8 F (36.6 C)   TempSrc:  Oral Oral   SpO2: 99%   98%   Physical Exam Vitals signs and nursing note reviewed.  Constitutional:      General: She is not in acute distress.    Appearance: She is not ill-appearing.  Cardiovascular:     Rate and Rhythm: Normal rate and regular rhythm.     Heart sounds: Normal heart sounds.  Pulmonary:     Effort: Pulmonary effort is normal. Prolonged expiration present. No respiratory distress.     Comments: Scant end-expiratory wheezes at bases Abdominal:     General: Abdomen is flat. Bowel sounds are normal.     Palpations: Abdomen is soft.  Neurological:     Mental Status: She is alert.    Assessment/Plan:  Principal Problem:   Asthma exacerbation Active Problems:   Essential hypertension   Type 2 diabetes mellitus with hyperglycemia (HCC)   Hypoxia 2/2 Respiratory Illness on Asthma Exacerbation: Patient comes to the ED with hypoxia thought to be secondary to asthma exacerbation, triggered by Rhinovirus. Without fever or leukocytosis. COVID: negative. Chest xray showed no consolidation, opacities, or atelectasis. Pt states her asthma has been well controlled at home and has not needed her Advair inhaler in almost 1 year. - duoneb 3 times daily and dulera 2 times daily and albuterol nebulizer PRN - prednisone 40 mg day 2, continue at discharge for 10 day taper - discontinue supplemental oxygen - can discharge after ambulation with pulse oximetry   Hypertension:  - holding home Benicar - can restart at discharge home   Diet - heart healthy Fluids - none DVT ppx - lovenox 40mg  subQ q24h CODE STATUS - FULL CODE   Dispo:  Anticipated discharge in approximately 0-1 day(s).   Ladona Horns, MD 08/07/2019, 6:49 AM Pager: [phone]

## 2019-08-07 NOTE — Progress Notes (Signed)
SATURATION QUALIFICATIONS: (This note is used to comply with regulatory documentation for home oxygen)  Patient Saturations on Room Air at Rest = 96%  Patient Saturations on Room Air while Ambulating = 94%  Patient Saturations on 0 Liters of oxygen while Ambulating = 94%  Please briefly explain why patient needs home oxygen:  Pt. Saturated well on room air. Did not put on oxygen.

## 2019-08-07 NOTE — Discharge Summary (Signed)
Name: [name] MRN: [id number] DOB: [birth year] 48 y.o. PCP: Chesley Noon, MD  Date of Admission: 08/05/2019 11:35 AM Date of Discharge: 08/07/2019 Attending Physician: Aldine Contes, MD  Discharge Diagnosis: 1. Hypoxia secondary to asthma exacerbation in the setting of respiratory illness  Discharge Medications: Allergies as of 08/07/2019      Reactions   Shellfish Allergy Swelling   Watermelon [citrullus Vulgaris] Swelling      Medication List    STOP taking these medications   DAYQUIL PO     TAKE these medications   acetaminophen 500 MG tablet Commonly known as: TYLENOL Take 1,000 mg by mouth daily as needed (for headache).   albuterol 108 (90 Base) MCG/ACT inhaler Commonly known as: VENTOLIN HFA Inhale 1-2 puffs into the lungs every 6 (six) hours as needed for wheezing or shortness of breath.   albuterol (2.5 MG/3ML) 0.083% nebulizer solution Commonly known as: PROVENTIL Take 2.5 mg by nebulization every 6 (six) hours as needed for wheezing.   Fluticasone-Salmeterol 500-50 MCG/DOSE Aepb Commonly known as: ADVAIR Inhale 1 puff into the lungs every evening.   meloxicam 15 MG tablet Commonly known as: MOBIC Take 15 mg by mouth daily.   metFORMIN 500 MG tablet Commonly known as: GLUCOPHAGE Take 1,000 mg by mouth 2 (two) times daily with a meal.   olmesartan-hydrochlorothiazide 20-12.5 MG tablet Commonly known as: BENICAR HCT Take 1 tablet by mouth every evening.   predniSONE 20 MG tablet Commonly known as: DELTASONE Take 2 tablets (40 mg total) by mouth daily with breakfast for 2 days, THEN 1.5 tablets (30 mg total) daily with breakfast for 3 days, THEN 1 tablet (20 mg total) daily with breakfast for 2 days, THEN 0.5 tablets (10 mg total) daily with breakfast for 1 day. Start taking on: August 08, 2019       Disposition and follow-up:   [name] was discharged from [facility] in Good condition.  At the hospital  follow up visit please address:  1.  [name] was admitted for severe asthma exacerbation due to rhinovirus infection. COVID-19 test was negative. She improved with systemic steroids and bronchodilators. Discharged with a 10 day prednisone taper and Dulera inhaler 2 puffs BID. Advised to follow-up with PCP regarding continued need for maintenance inhaler.   2.  Labs / imaging needed at time of follow-up: NONE  3.  Pending labs/ test needing follow-up: NONE  Follow-up Appointments: Follow-up Information    Chesley Noon, MD. Call in 1 week(s).   Specialty: Family Medicine Why: Call your primary provider to schedule a hospital follow-up in approximately 1 week Contact information: [place] [address] [place] by problem list: 1. Asthma exacerbation [name] is a 48 year old female with significant PMH of chronic well-controlled asthma, diabetes, and HTN who presented with respiratory distress, tachypnea, and hypoxia. She was only initially only able to speak 3-5 words at time. Her daughter was sick with a URI 5 days prior, so they were initial concern for a COVID contact. CXR was unremarkable. COVID-19 test was negative. RVP was positive for rhinovirus. Pt was afebrile and without a leukocytosis. Initially required 2L [place]. Her clinical condition improved with systemic steroids and scheduled bronchodilators. Before discharge, pt's saturation was 96% at rest on RA and 94% while ambulating on RA. She was discharged with a 10 day prednisone taper and Dulera inhaler (2 puffs BID)  which she was using during her hospitalization. Pt reported that she had been off her Advair inhaler for almost one year. In[facility] while at home in the acute setting and reassess continued need for maintenance inhaler with her PCP.  Discharge Vitals:   BP 114/84 (BP Location: Left Arm)    Pulse 90    Temp 97.6 F (36.4 C)  (Oral)    Resp 17    SpO2 95%   Pertinent Labs, Studies, and Procedures:  CBC Latest Ref Rng & Units 08/07/2019 08/06/2019 08/05/2019  WBC 4.0 - 10.5 K/uL 10.2 8.0 -  Hemoglobin 12.0 - 15.0 g/dL 45.412.2 09.812.7 11.913.3  Hematocrit 36.0 - 46.0 % 37.2 37.2 39.0  Platelets 150 - 400 K/uL 255 244 -   BMP Latest Ref Rng & Units 08/07/2019 08/06/2019 08/05/2019  Glucose 70 - 99 mg/dL 147(W158(H) 295(A314(H) -  BUN 6 - 20 mg/dL 11 10 -  Creatinine 2.130.44 - 1.00 mg/dL 0.860.77 5.780.81 -  Sodium 469135 - 145 mmol/L 141 138 137  Potassium 3.5 - 5.1 mmol/L 3.7 3.8 3.6  Chloride 98 - 111 mmol/L 104 103 -  CO2 22 - 32 mmol/L 26 20(L) -  Calcium 8.9 - 10.3 mg/dL 6.2(X8.8(L) 8.9 -    EKG Interpretation  Date/Time:  Saturday August 05 2019 16:06:05 EDT Ventricular Rate:  116 PR Interval:    QRS Duration: 95 QT Interval:  394 QTC Calculation: 548 R Axis:   58 Text Interpretation:  Sinus tachycardia Nonspecific T abnormalities, diffuse leads Prolonged QT interval Baseline wander in lead(s) V3 V6 No STEMI  Confirmed by Alona BeneLong, Joshua [phone]([id number]) on 08/06/2019 2:27:29 PM      Recent Results (from the past 240 hour(s))  SARS CORONAVIRUS 2 (TAT 6-24 HRS) Nasopharyngeal Nasopharyngeal Swab     Status: None   Collection Time: 08/05/19 12:24 PM   Specimen: Nasopharyngeal Swab  Result Value Ref Range Status   SARS Coronavirus 2 NEGATIVE NEGATIVE Final    Comment: (NOTE) SARS-CoV-2 target nucleic acids are NOT DETECTED. The SARS-CoV-2 RNA is generally detectable in upper and lower respiratory specimens during the acute phase of infection. Negative results do not preclude SARS-CoV-2 infection, do not rule out co-infections with other pathogens, and should not be used as the sole basis for treatment or other patient management decisions. Negative results must be combined with clinical observations, patient history, and epidemiological information. The expected result is Negative. Fact Sheet for  Patients: HairSlick.nohttps://www.fda.gov/media/138098/download Fact Sheet for Healthcare Providers: quierodirigir.comhttps://www.fda.gov/media/138095/download This test is not yet approved or cleared by the [place] FDA and  has been authorized for detection and/or diagnosis of SARS-CoV-2 by FDA under an Emergency Use Authorization (EUA). This EUA will remain  in effect (meaning this test can be used) for the duration of the COVID-19 declaration under Section 56 4(b)(1) of the Act, 21 U.S.C. section 360bbb-3(b)(1), unless the authorization is terminated or revoked sooner. Performed at [facility], [address] [place], [place] [address]   Respiratory Panel by PCR     Status: Abnormal   Collection Time: 08/05/19  8:03 PM   Specimen: Nasopharyngeal Swab; Respiratory  Result Value Ref Range Status   Adenovirus NOT DETECTED NOT DETECTED Final   Coronavirus 229E NOT DETECTED NOT DETECTED Final    Comment: (NOTE) The Coronavirus on the Respiratory Panel, DOES NOT test for the novel  Coronavirus (2019 nCoV)    Coronavirus HKU1 NOT DETECTED NOT DETECTED Final   Coronavirus NL63 NOT  DETECTED NOT DETECTED Final   Coronavirus OC43 NOT DETECTED NOT DETECTED Final   Metapneumovirus NOT DETECTED NOT DETECTED Final   Rhinovirus / Enterovirus DETECTED (A) NOT DETECTED Final   Influenza A NOT DETECTED NOT DETECTED Final   Influenza B NOT DETECTED NOT DETECTED Final   Parainfluenza Virus 1 NOT DETECTED NOT DETECTED Final   Parainfluenza Virus 2 NOT DETECTED NOT DETECTED Final   Parainfluenza Virus 3 NOT DETECTED NOT DETECTED Final   Parainfluenza Virus 4 NOT DETECTED NOT DETECTED Final   Respiratory Syncytial Virus NOT DETECTED NOT DETECTED Final   Bordetella pertussis NOT DETECTED NOT DETECTED Final   Chlamydophila pneumoniae NOT DETECTED NOT DETECTED Final   Mycoplasma pneumoniae NOT DETECTED NOT DETECTED Final    Comment: Performed at [facility], [address] [place], [place] [address]     Chest X-ray from 08/05/2019 CLINICAL DATA:  Shortness of breath, wheezing, possible COVID exposure  EXAM: PORTABLE CHEST 1 VIEW  COMPARISON:  10/05/2010  FINDINGS: Lungs are clear.  No pleural effusion or pneumothorax.  The heart is normal in size.  IMPRESSION: No evidence of acute cardiopulmonary disease.   Discharge Instructions: Discharge Instructions    Diet - low sodium heart healthy   Complete by: As directed    Discharge instructions   Complete by: As directed    [name],  You were treated in the hospital for an asthma exacerbation triggered by a rhinovirus infection. Your breathing improved with some breathing treatments and extra oxygen. We gave you some steroids in the hospital to help decrease the inflammation in the lungs. Please continue taking a prednisone taper at home for another 8 days, starting tomorrow. You can also continue using the [facility] inhaler you were using in the hospital over the next week or so.  Please call your primary care physician if you begin experiencing cough, worsening shortness of breath, trouble breathing, or fevers/chills at home.   Increase activity slowly   Complete by: As directed       Signed: Thom Chimes, MD 08/07/2019, 3:12 PM   Pager: [phone]

## 2021-02-06 IMAGING — DX DG CHEST 1V PORT
1 series · 1 of 1 positions shown · non-contrast
Comparison: 10/05/2010

CLINICAL DATA: Shortness of breath, wheezing, possible COVID
exposure

EXAM:
PORTABLE CHEST 1 VIEW

[chest]
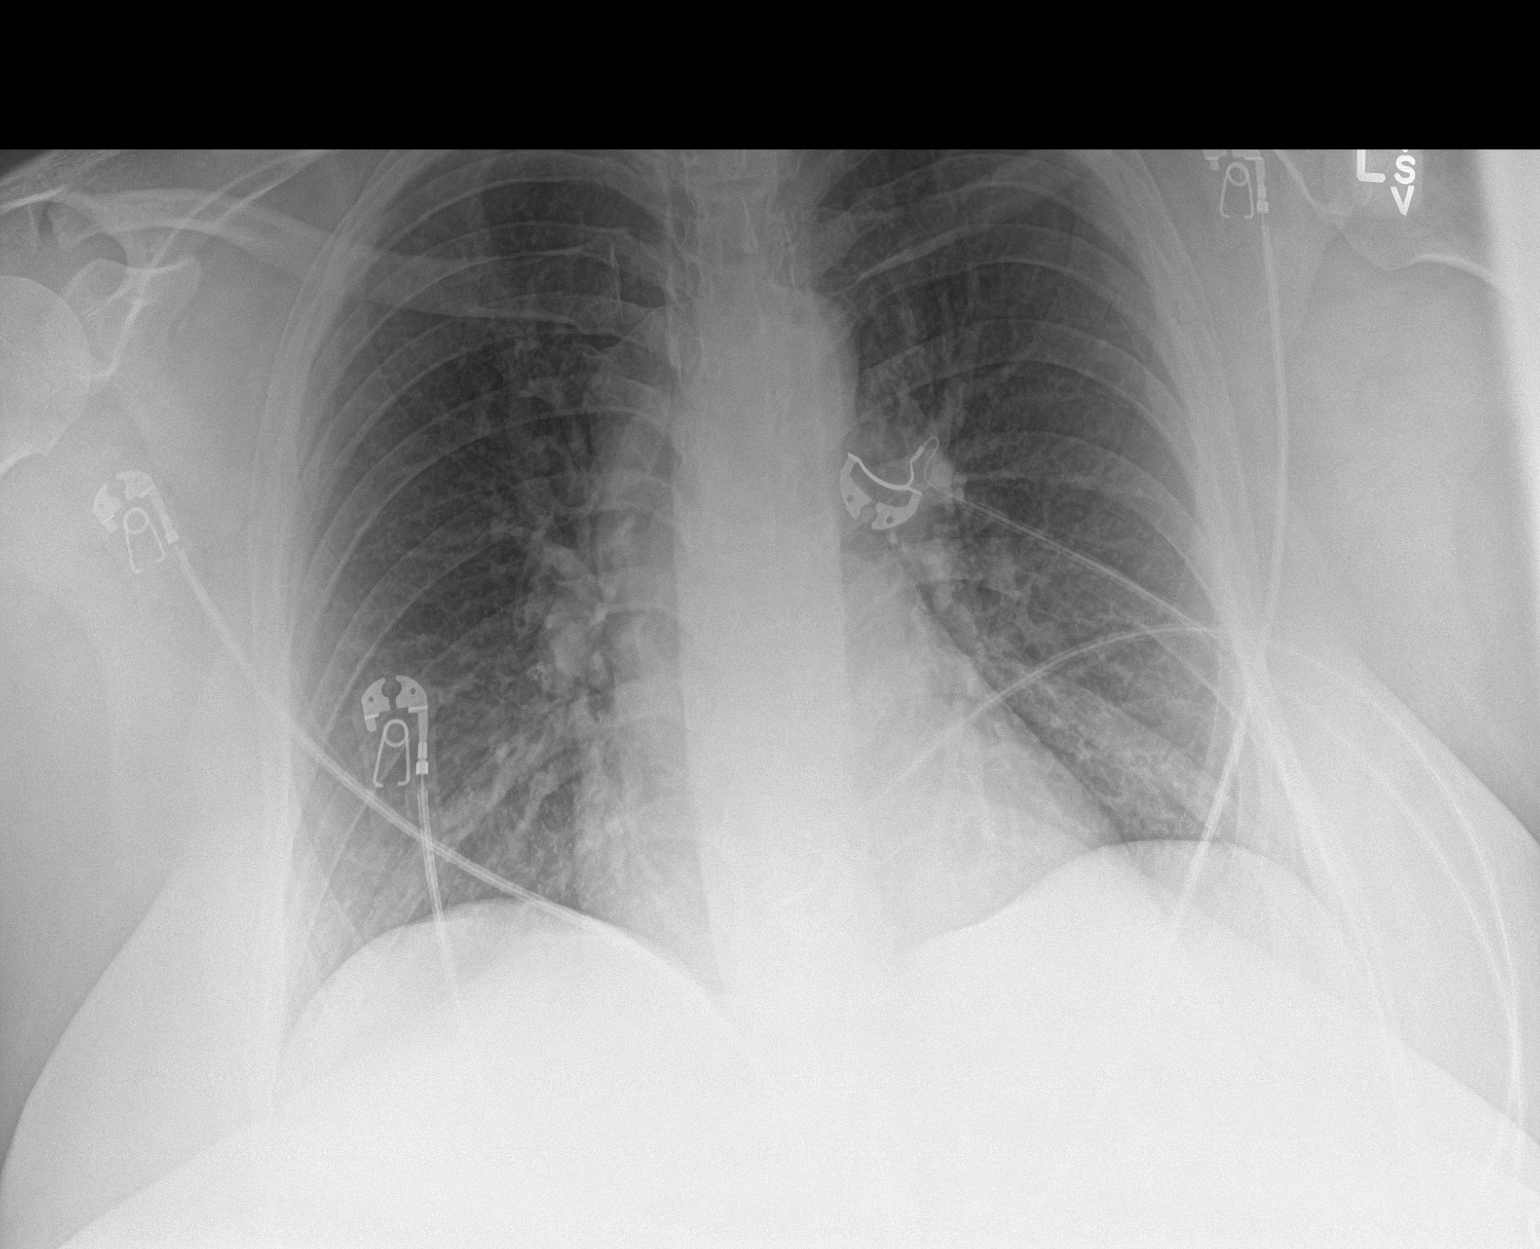

[1 of 1 positions shown; findings below may reference images not displayed]

FINDINGS: Lungs are clear.  No pleural effusion or pneumothorax.

The heart is normal in size.
IMPRESSION: No evidence of acute cardiopulmonary disease.

## 2022-05-09 ENCOUNTER — Encounter (HOSPITAL_COMMUNITY): Payer: Self-pay | Admitting: Emergency Medicine

## 2022-05-09 ENCOUNTER — Ambulatory Visit (HOSPITAL_COMMUNITY)
Admission: EM | Admit: 2022-05-09 | Discharge: 2022-05-09 | Disposition: A | Discharge status: Home / Self Care | Payer: Managed Care, Other (non HMO)

## 2022-05-09 DIAGNOSIS — H6502 Acute serous otitis media, left ear: Secondary | ICD-10-CM

## 2022-05-09 HISTORY — DX: Type 2 diabetes mellitus without complications: E11.9

## 2022-05-09 HISTORY — DX: Bipolar disorder, unspecified: F31.9

## 2022-05-09 HISTORY — DX: Pure hypercholesterolemia, unspecified: E78.00

## 2022-05-09 HISTORY — DX: Depression, unspecified: F32.A

## 2022-05-09 MED ORDER — AMOXICILLIN 500 MG PO CAPS: 500.0000 mg | ORAL_CAPSULE | Freq: Two times a day (BID) | ORAL | 0 refills | Status: AC

## 2022-05-09 MED ORDER — PREDNISONE 20 MG PO TABS: 20.0000 mg | ORAL_TABLET | Freq: Every day | ORAL | 0 refills | Status: AC

## 2022-05-09 NOTE — ED Triage Notes (Signed)
Pt reports left ear pain since Thursday/Friday that is getting worse and unable to wait to see PCP Monday. Reports hx ear infection.

## 2022-05-09 NOTE — Discharge Instructions (Signed)
Today you are being treated for an infection of the eardrum  Take amoxicillin twice daily for 10 days, you should begin to see improvement after 48 hours of medication use and then it should progressively get better  May use prednisone every morning with food for 5 days to help reduce inflammation and discomfort to the ear  You may use Tylenol or ibuprofen for management of discomfort  May hold warm compresses to the ear for additional comfort  Please not attempted any ear cleaning or object or fluid placement into the ear canal to prevent further irritation

## 2022-05-09 NOTE — ED Provider Notes (Signed)
[facility]    CSN: [id number] Arrival date & time: 05/09/22  1637      History   Chief Complaint Chief Complaint  Patient presents with   Otalgia    HPI [name] is a 51 y.o. female.   Patient presents with left-sided ear pain beginning 2 days ago.  Symptoms worsening overnight.  Associated itching.  Has attempted use of colloidal silver which was effective in managing itching but not pain.  Denies fever, chills, URI symptoms, ear drainage.  Denies recent water activity.    Past Medical History:  Diagnosis Date   Asthma    Bipolar 1 disorder (HCC)    Depression    Diabetes mellitus without complication (HCC)    High cholesterol    Hypertension     Patient Active Problem List   Diagnosis Date Noted   Asthma exacerbation 08/05/2019   Hyperlipidemia 11/15/2018   Type 2 diabetes mellitus with hyperglycemia (HCC) 11/15/2018   Closed trimalleolar fracture of left ankle 11/30/2012   Asthma 11/06/2009   Essential hypertension 05/20/2009    Past Surgical History:  Procedure Laterality Date   CESAREAN SECTION  10/19/1998   EYE SURGERY     FIBULA FRACTURE SURGERY  10/19/2006   right leg   ORIF ANKLE FRACTURE Left 12/01/2012   Procedure: OPEN REDUCTION INTERNAL FIXATION (ORIF) ANKLE FRACTURE;  Surgeon: Eldred Manges, MD;  Location: [facility] OR;  Service: Orthopedics;  Laterality: Left;   ORIF ANKLE FRACTURE BIMALLEOLAR  12/01/2012   Dr Ophelia Charter   RETINAL DETACHMENT SURGERY  10/19/1997   OD    OB History   No obstetric history on file.      Home Medications    Prior to Admission medications   Medication Sig Start Date End Date Taking? Authorizing Provider  acetaminophen (TYLENOL) 500 MG tablet Take 1,000 mg by mouth daily as needed (for headache).    [provider]  albuterol (PROVENTIL) (2.5 MG/3ML) 0.083% nebulizer solution Take 2.5 mg by nebulization every 6 (six) hours as needed for wheezing.    [provider]  albuterol (VENTOLIN  HFA) 108 (90 Base) MCG/ACT inhaler Inhale 1-2 puffs into the lungs every 6 (six) hours as needed for wheezing or shortness of breath.    [provider]  CLIMARA PRO 0.045-0.015 MG/DAY Place 1 patch onto the skin once a week. 03/30/22   [provider]  Fluticasone-Salmeterol (ADVAIR) 500-50 MCG/DOSE AEPB Inhale 1 puff into the lungs every evening.    [provider]  JANUVIA 100 MG tablet Take 100 mg by mouth daily. 02/19/22   [provider]  lamoTRIgine (LAMICTAL) 150 MG tablet Take 150 mg by mouth daily. 03/30/22   [provider]  meloxicam (MOBIC) 15 MG tablet Take 15 mg by mouth daily. 11/21/18   [provider]  metFORMIN (GLUCOPHAGE) 500 MG tablet Take 1,000 mg by mouth 2 (two) times daily with a meal.    [provider]  olmesartan (BENICAR) 20 MG tablet Take 10 mg by mouth daily. 03/17/22   [provider]  olmesartan-hydrochlorothiazide (BENICAR HCT) 20-12.5 MG per tablet Take 1 tablet by mouth every evening.    [provider]  TRULICITY 0.75 MG/0.5ML SOPN Inject 0.75 mg into the skin once a week. 05/05/22   [provider]    Family History No family history on file.  Social History Social History   Tobacco Use   Smoking status: Never   Smokeless tobacco: Never  Substance Use Topics  Alcohol use: Yes    Comment: occ   Drug use: No     Allergies   Shellfish allergy and Watermelon [citrullus vulgaris]   Review of Systems Review of Systems  Constitutional: Negative.   HENT:  Positive for ear pain. Negative for congestion, dental problem, drooling, ear discharge, facial swelling, hearing loss, mouth sores, nosebleeds, postnasal drip, rhinorrhea, sinus pressure, sinus pain, sneezing, sore throat, tinnitus, trouble swallowing and voice change.   Respiratory: Negative.    Cardiovascular: Negative.      Physical Exam Triage Vital Signs ED Triage Vitals  Enc Vitals Group     BP  05/09/22 1714 106/70     Pulse Rate 05/09/22 1714 94     Resp 05/09/22 1714 18     Temp 05/09/22 1714 98.9 F (37.2 C)     Temp Source 05/09/22 1714 Oral     SpO2 05/09/22 1714 97 %     Weight --      Height --      Head Circumference --      Peak Flow --      Pain Score 05/09/22 1709 8     Pain Loc --      Pain Edu? --      Excl. in GC? --    No data found.  Updated Vital Signs BP 106/70 (BP Location: Left Arm)   Pulse 94   Temp 98.9 F (37.2 C) (Oral)   Resp 18   LMP 10/12/2016   SpO2 97%   Visual Acuity Right Eye Distance:   Left Eye Distance:   Bilateral Distance:    Right Eye Near:   Left Eye Near:    Bilateral Near:     Physical Exam Constitutional:      Appearance: Normal appearance.  HENT:     Head: Normocephalic.     Right Ear: Hearing, tympanic membrane, ear canal and external ear normal.     Left Ear: Hearing, ear canal and external ear normal. Tympanic membrane is erythematous.  Eyes:     Extraocular Movements: Extraocular movements intact.  Pulmonary:     Effort: Pulmonary effort is normal.  Neurological:     Mental Status: She is alert.      UC Treatments / Results  Labs (all labs ordered are listed, but only abnormal results are displayed) Labs Reviewed - No data to display  EKG   Radiology No results found.  Procedures Procedures (including critical care time)  Medications Ordered in UC Medications - No data to display  Initial Impression / Assessment and Plan / UC Course  I have reviewed the triage vital signs and the nursing notes.  Pertinent labs & imaging results that were available during my care of the patient were reviewed by me and considered in my medical decision making (see chart for details).  Acute nonrecurrent serous otitis media of the left ear  Erythematous noted to the left tympanic membrane, discussed findings with patient, amoxicillin 10-day course prescribed and patient endorses that she typically needs  prednisone to help manage with the discomfort, 20 mg 5-day course prescribed, recommended use of over-the-counter analgesics and warm compresses to the external ear for additional comfort and advised against any fluid, object placement into the canal to prevent further irritation, may follow-up with urgent care or PCP if symptoms persist or worsen Final Clinical Impressions(s) / UC Diagnoses   Final diagnoses:  None   Discharge Instructions   None    ED Prescriptions  None    PDMP not reviewed this encounter.   Valinda Hoar, NP 05/09/22 1727

## 2024-11-23 ENCOUNTER — Other Ambulatory Visit: Payer: Self-pay | Admitting: Family Medicine

## 2024-11-23 DIAGNOSIS — Z1231 Encounter for screening mammogram for malignant neoplasm of breast: Secondary | ICD-10-CM

## 2024-11-29 ENCOUNTER — Ambulatory Visit
